# Patient Record
Sex: Male | Born: 1935 | Race: White | Hispanic: No | Marital: Married | Smoking: Never smoker
Health system: Southern US, Community
[De-identification: ages and names within clinical notes are randomized; demographics above are authoritative.]

## PROBLEM LIST (undated history)

## (undated) DIAGNOSIS — E785 Hyperlipidemia, unspecified: Secondary | ICD-10-CM

## (undated) DIAGNOSIS — F0280 Dementia in other diseases classified elsewhere without behavioral disturbance: Secondary | ICD-10-CM

## (undated) DIAGNOSIS — F0390 Unspecified dementia without behavioral disturbance: Secondary | ICD-10-CM

## (undated) DIAGNOSIS — K219 Gastro-esophageal reflux disease without esophagitis: Secondary | ICD-10-CM

## (undated) DIAGNOSIS — I10 Essential (primary) hypertension: Secondary | ICD-10-CM

## (undated) DIAGNOSIS — G309 Alzheimer's disease, unspecified: Secondary | ICD-10-CM

---

## 1999-04-11 ENCOUNTER — Encounter: Payer: Self-pay | Admitting: General Surgery

## 1999-04-16 ENCOUNTER — Ambulatory Visit (HOSPITAL_COMMUNITY): Admission: RE | Admit: 1999-04-16 | Discharge: 1999-04-16 | Payer: Self-pay | Admitting: General Surgery

## 2012-03-01 ENCOUNTER — Emergency Department (HOSPITAL_COMMUNITY)
Admission: EM | Admit: 2012-03-01 | Discharge: 2012-03-01 | Disposition: A | Discharge status: Other Acute Inpatient Hospital | Payer: Medicare Other | Attending: Emergency Medicine | Admitting: Emergency Medicine

## 2012-03-01 ENCOUNTER — Emergency Department (HOSPITAL_COMMUNITY): Payer: Medicare Other

## 2012-03-01 ENCOUNTER — Encounter (HOSPITAL_COMMUNITY): Payer: Self-pay

## 2012-03-01 ENCOUNTER — Other Ambulatory Visit: Payer: Self-pay

## 2012-03-01 DIAGNOSIS — R279 Unspecified lack of coordination: Secondary | ICD-10-CM | POA: Insufficient documentation

## 2012-03-01 DIAGNOSIS — Z79899 Other long term (current) drug therapy: Secondary | ICD-10-CM | POA: Insufficient documentation

## 2012-03-01 DIAGNOSIS — R270 Ataxia, unspecified: Secondary | ICD-10-CM

## 2012-03-01 DIAGNOSIS — I10 Essential (primary) hypertension: Secondary | ICD-10-CM | POA: Insufficient documentation

## 2012-03-01 DIAGNOSIS — Z7982 Long term (current) use of aspirin: Secondary | ICD-10-CM | POA: Insufficient documentation

## 2012-03-01 DIAGNOSIS — E785 Hyperlipidemia, unspecified: Secondary | ICD-10-CM | POA: Insufficient documentation

## 2012-03-01 DIAGNOSIS — F0390 Unspecified dementia without behavioral disturbance: Secondary | ICD-10-CM | POA: Insufficient documentation

## 2012-03-01 DIAGNOSIS — K219 Gastro-esophageal reflux disease without esophagitis: Secondary | ICD-10-CM | POA: Insufficient documentation

## 2012-03-01 HISTORY — DX: Hyperlipidemia, unspecified: E78.5

## 2012-03-01 HISTORY — DX: Essential (primary) hypertension: I10

## 2012-03-01 HISTORY — DX: Gastro-esophageal reflux disease without esophagitis: K21.9

## 2012-03-01 HISTORY — DX: Unspecified dementia, unspecified severity, without behavioral disturbance, psychotic disturbance, mood disturbance, and anxiety: F03.90

## 2012-03-01 LAB — URINALYSIS, ROUTINE W REFLEX MICROSCOPIC
Glucose, UA: NEGATIVE mg/dL
Hgb urine dipstick: NEGATIVE
Ketones, ur: NEGATIVE mg/dL
Leukocytes, UA: NEGATIVE
Protein, ur: NEGATIVE mg/dL
pH: 6 (ref 5.0–8.0)

## 2012-03-01 LAB — POCT I-STAT, CHEM 8
BUN: 20 mg/dL (ref 6–23)
Calcium, Ion: 1.18 mmol/L (ref 1.13–1.30)
Chloride: 106 mEq/L (ref 96–112)
Creatinine, Ser: 1.4 mg/dL — ABNORMAL HIGH (ref 0.50–1.35)
TCO2: 27 mmol/L (ref 0–100)

## 2012-03-01 MED ORDER — SODIUM CHLORIDE 0.9 % IV BOLUS (SEPSIS): 1000.0000 mL | INTRAVENOUS | Status: DC

## 2012-03-01 NOTE — ED Provider Notes (Signed)
History     CSN: [id number]  Arrival date & time 03/01/12  1037   None     Chief Complaint  Patient presents with  . Altered Mental Status    (Consider location/radiation/quality/duration/timing/severity/associated sxs/prior treatment) Patient is a 79 y.o. male presenting with neurologic complaint. History provided by: EMS, anna at facility.  Neurologic Problem Primary symptoms do not include headaches, fever, nausea or vomiting. The symptoms began 2 to 6 hours ago. The symptoms are unchanged. The neurological symptoms are diffuse. Context: noticed today at facility.  Additional symptoms include loss of balance.    Past Medical History  Diagnosis Date  . Dementia   . Hypertension   . GERD (gastroesophageal reflux disease)   . Hyperlipidemia     No past surgical history on file.  No family history on file.  History  Substance Use Topics  . Smoking status: Unknown If Ever Smoked  . Smokeless tobacco: Not on file  . Alcohol Use: No      Review of Systems  Constitutional: Negative for fever.  HENT: Negative for rhinorrhea, drooling and neck pain.   Eyes: Negative for pain.  Respiratory: Negative for cough and shortness of breath.   Cardiovascular: Negative for chest pain and leg swelling.  Gastrointestinal: Negative for nausea, vomiting, abdominal pain and diarrhea.  Genitourinary: Negative for dysuria and hematuria.  Musculoskeletal: Negative for gait problem.  Skin: Negative for color change.  Neurological: Positive for loss of balance. Negative for numbness and headaches.  Hematological: Negative for adenopathy.  Psychiatric/Behavioral: Negative for behavioral problems.  All other systems reviewed and are negative.    Allergies  Review of patient's allergies indicates no known allergies.  Home Medications   Current Outpatient Rx  Name  Route  Sig  Dispense  Refill  . amLODipine (NORVASC) 5 MG tablet   Oral   Take 5 mg by mouth daily before  breakfast.         . aspirin 81 MG chewable tablet   Oral   Chew 81 mg by mouth at bedtime.         . Calcium Carbonate-Vitamin D (CALCIUM 600 + D PO)   Oral   Take 1 tablet by mouth daily.         Marland Kitchen donepezil (ARICEPT) 10 MG tablet   Oral   Take 10 mg by mouth at bedtime.         Marland Kitchen escitalopram (LEXAPRO) 20 MG tablet   Oral   Take 20 mg by mouth daily.         Marland Kitchen lamoTRIgine (LAMICTAL) 200 MG tablet   Oral   Take 200 mg by mouth 2 (two) times daily. Takes with two 25 mg tablets to equal 250 twice daily         . lamoTRIgine (LAMICTAL) 25 MG tablet   Oral   Take 50 mg by mouth 2 (two) times daily. Takes 2 tablets along with 200 mg tablet to equal 250 mg twice daily         . loperamide (IMODIUM) 2 MG capsule   Oral   Take 2 mg by mouth 4 (four) times daily as needed for diarrhea or loose stools.         Marland Kitchen LORazepam (ATIVAN) 2 MG/ML concentrated solution   Oral   Take 0.3 mg by mouth 2 (two) times daily as needed (for anxiety/agitation).         Marland Kitchen lovastatin (MEVACOR) 20 MG tablet   Oral  Take 20 mg by mouth at bedtime.         . magnesium hydroxide (MILK OF MAGNESIA) 400 MG/5ML suspension   Oral   Take 30 mLs by mouth at bedtime as needed for constipation.         . metoprolol tartrate (LOPRESSOR) 25 MG tablet   Oral   Take 25 mg by mouth 2 (two) times daily.         Marland Kitchen omeprazole (PRILOSEC) 20 MG capsule   Oral   Take 20 mg by mouth every evening.         . ramipril (ALTACE) 10 MG capsule   Oral   Take 10 mg by mouth daily before breakfast.         . risperiDONE (RISPERDAL) 1 MG/ML oral solution   Oral   Take 0.5 mg by mouth 2 (two) times daily.           BP 165/57  Pulse 59  Temp(Src) 97.7 F (36.5 C) (Oral)  Resp 13  SpO2 97%  Physical Exam  Nursing note and vitals reviewed. Constitutional: He appears well-developed and well-nourished.  HENT:  Head: Normocephalic and atraumatic.  Right Ear: External ear normal.   Left Ear: External ear normal.  Nose: Nose normal.  Mouth/Throat: Oropharynx is clear and moist. No oropharyngeal exudate.  Eyes: Conjunctivae and EOM are normal. Pupils are equal, round, and reactive to light.  Neck: Normal range of motion. Neck supple.  Cardiovascular: Normal rate, regular rhythm, normal heart sounds and intact distal pulses.  Exam reveals no gallop and no friction rub.   No murmur heard. Pulmonary/Chest: Effort normal and breath sounds normal. No respiratory distress. He has no wheezes.  Abdominal: Soft. Bowel sounds are normal. He exhibits no distension. There is no tenderness. There is no rebound and no guarding.  Musculoskeletal: Normal range of motion. He exhibits no edema and no tenderness.  Neurological: He is alert. He has normal strength. No cranial nerve deficit or sensory deficit.  Pt a/o x 1. Pt able to stand, but has unsteady gait requiring assistance. No truncal ataxia noted.   Skin: Skin is warm and dry.  Psychiatric: He has a normal mood and affect. His behavior is normal.    ED Course  Procedures (including critical care time)  Labs Reviewed  URINALYSIS, ROUTINE W REFLEX MICROSCOPIC - Abnormal; Notable for the following:    APPearance CLOUDY (*)    All other components within normal limits  POCT I-STAT, CHEM 8 - Abnormal; Notable for the following:    Creatinine, Ser 1.40 (*)    Hemoglobin 12.9 (*)    HCT 38.0 (*)    All other components within normal limits   Ct Head Wo Contrast  03/01/2012  *RADIOLOGY REPORT*  Clinical Data: Difficulty walking.  Alzheimer's.  CT HEAD WITHOUT CONTRAST  Technique:  Contiguous axial images were obtained from the base of the skull through the vertex without contrast.  Comparison: None  Findings: Moderate to advanced atrophy.  Mild chronic microvascular ischemia in the white matter.  Negative for acute infarct. Negative for hemorrhage or mass.  No subdural hemorrhage.  No midline shift.  Calvarium is intact  IMPRESSION:  Atrophy and chronic microvascular ischemia.  No acute abnormality.   Original Report Authenticated By: Janeece Riggers, M.D.      1. Ataxia     Date: 03/01/2012  Rate: 61  Rhythm: sinus rhythm w/ 1st degree AV block  QRS Axis: normal  Intervals: PR prolonged  ST/T Wave abnormalities: normal  Conduction Disutrbances:right bundle branch block and PVC  Narrative Interpretation: No new ST or T wave changes cw ischemia  Old EKG Reviewed: changes noted     MDM  9:10 PM 79 y.o. male w hx of multi-infarct dementia, HTN presents from facility w/ ataxia that began this morning. I spoke w/ Tobi Bastos (med tech) at her facility to confirm this, she states he has otherwise been well. Pt is a/o x 1 on exam, interactive w/out motor/sensory deficits. Pt able to stand, but does have difficulty ambulating w/out assistance. Will get screening ecg, labs, urine, CT head.    9:10 PM: Pt tolerated a meal, labs/imaging non-contrib. He is now ambulating w/ less assistance. Unknown cause of his ataxia, but as he appears well will rec f/u w/ his pcp.  I have discussed the diagnosis/risks/treatment options with the patient and believe the pt to be eligible for discharge home to follow-up with his pcp in 1-2 days. We also discussed returning to the ED immediately if new or worsening sx occur. We discussed the sx which are most concerning (e.g., worsening ataxia, AMS, neuro deficits) that necessitate immediate return. Any new prescriptions provided to the patient are listed below.  Discharge Medication List as of 03/01/2012  3:22 PM     Clinical Impression 1. Ataxia       Purvis Sheffield, MD 03/01/12 2110  Purvis Sheffield, MD 03/01/12 2112  Purvis Sheffield, MD 03/01/12 2113

## 2012-03-01 NOTE — ED Notes (Signed)
[id number]<AV> Expected date:03/01/12<BR> Expected time:10:31 AM<BR> Means of arrival:Ambulance<BR> Comments:<BR> Alz, pt, more confused today

## 2012-03-01 NOTE — ED Notes (Addendum)
Sleeping intermittently, pleasant and following commands. Oriented to only person. MD has been at bedside.

## 2012-03-01 NOTE — ED Notes (Signed)
Patient transported to CT 

## 2012-03-01 NOTE — ED Notes (Signed)
Per EMS comes from [facility] for [employer]. Staff states patient was weaker and a little more confused than normal. BP taken by staff 80/40. EMS called. Patient usually up and walking. CBG 113, SPO2 98% on RA

## 2012-03-01 NOTE — ED Notes (Signed)
Pt. ekg was performed. ekg was was seen  Dr. Rosalia Hammers

## 2012-03-01 NOTE — ED Notes (Signed)
Patient had to be moved to chair keeps getting out of bed. Officers & lab tech present. Suggested to Residence of giving patient something to calm patient request declined.  Patient moved now to hall to keep 1:1.

## 2012-03-01 NOTE — ED Notes (Signed)
Patient agitated. Passerby caught patient trying to get out of bed. Patient agitated and confused

## 2012-03-01 NOTE — ED Notes (Signed)
Patient has intermittent periods of confusion.Compliant most of the time when given direction. Daughter never arrived.

## 2012-03-01 NOTE — ED Provider Notes (Signed)
I saw and evaluated the patient, reviewed the resident's note and I agree with the findings and plan.   Labs and xrays reviewed--dementia likey as cause of current sx, stable for d/c  Toy Baker, MD 03/01/12 [phone]

## 2012-03-01 NOTE — ED Notes (Signed)
Out of wheelchair with assistance x2. MD present.

## 2012-03-01 NOTE — ED Notes (Addendum)
Daughter called inquiring about patient. Will be  here shortly

## 2012-03-03 NOTE — ED Provider Notes (Signed)
I saw and evaluated the patient, reviewed the resident's note and I agree with the findings and plan.  Keyatta Tolles T Ranyah Groeneveld, MD 03/03/12 0741 

## 2012-04-03 ENCOUNTER — Encounter (HOSPITAL_COMMUNITY): Payer: Self-pay | Admitting: *Deleted

## 2012-04-03 ENCOUNTER — Emergency Department (HOSPITAL_COMMUNITY)
Admission: EM | Admit: 2012-04-03 | Discharge: 2012-04-04 | Disposition: A | Discharge status: Skilled Nursing Facility | Payer: Medicare Other | Attending: Emergency Medicine | Admitting: Emergency Medicine

## 2012-04-03 DIAGNOSIS — R112 Nausea with vomiting, unspecified: Secondary | ICD-10-CM | POA: Insufficient documentation

## 2012-04-03 DIAGNOSIS — Z79899 Other long term (current) drug therapy: Secondary | ICD-10-CM | POA: Insufficient documentation

## 2012-04-03 DIAGNOSIS — K219 Gastro-esophageal reflux disease without esophagitis: Secondary | ICD-10-CM | POA: Insufficient documentation

## 2012-04-03 DIAGNOSIS — E785 Hyperlipidemia, unspecified: Secondary | ICD-10-CM | POA: Insufficient documentation

## 2012-04-03 DIAGNOSIS — I10 Essential (primary) hypertension: Secondary | ICD-10-CM | POA: Insufficient documentation

## 2012-04-03 DIAGNOSIS — R197 Diarrhea, unspecified: Secondary | ICD-10-CM | POA: Insufficient documentation

## 2012-04-03 LAB — POCT I-STAT, CHEM 8
BUN: 22 mg/dL (ref 6–23)
Creatinine, Ser: 1.4 mg/dL — ABNORMAL HIGH (ref 0.50–1.35)
Glucose, Bld: 128 mg/dL — ABNORMAL HIGH (ref 70–99)
Hemoglobin: 13.6 g/dL (ref 13.0–17.0)
Potassium: 4.2 mEq/L (ref 3.5–5.1)
Sodium: 140 mEq/L (ref 135–145)
TCO2: 25 mmol/L (ref 0–100)

## 2012-04-03 MED ORDER — SODIUM CHLORIDE 0.9 % IV BOLUS (SEPSIS)
500.0000 mL | Freq: Once | INTRAVENOUS | Status: AC
  Administered 2012-04-03: 500 mL via INTRAVENOUS

## 2012-04-03 MED ORDER — ONDANSETRON HCL 4 MG/2ML IJ SOLN: 4.0000 mg | Freq: Once | INTRAMUSCULAR | Status: DC

## 2012-04-03 MED ORDER — ONDANSETRON HCL 4 MG/2ML IJ SOLN
4.0000 mg | Freq: Once | INTRAMUSCULAR | Status: AC
  Administered 2012-04-03: 4 mg via INTRAVENOUS
  Filled 2012-04-03: qty 2

## 2012-04-03 MED ORDER — ONDANSETRON 4 MG PO TBDP: 4.0000 mg | ORAL_TABLET | Freq: Three times a day (TID) | ORAL | Status: DC | PRN

## 2012-04-03 MED ORDER — SODIUM CHLORIDE 0.9 % IV BOLUS (SEPSIS): 500.0000 mL | Freq: Once | INTRAVENOUS | Status: DC

## 2012-04-03 NOTE — ED Notes (Signed)
[id number]<VW> Expected date:<BR> Expected time:<BR> Means of arrival:<BR> Comments:<BR> EMS for 20

## 2012-04-03 NOTE — ED Notes (Signed)
Pt from [facility] on [address]. Pt with 1 episode of vomitting and 1 e

## 2012-04-03 NOTE — ED Notes (Signed)
Urine on sink in room

## 2012-04-03 NOTE — ED Notes (Signed)
Per NH staff other pt's at facility with n/v/d

## 2012-04-03 NOTE — ED Provider Notes (Signed)
History     CSN: [id number]  Arrival date & time 04/03/12  2058   First MD Initiated Contact with Patient 04/03/12 2137      Chief Complaint  Patient presents with  . Nausea  . Emesis  . Diarrhea    (Consider location/radiation/quality/duration/timing/severity/associated sxs/prior treatment) HPI Comments: 79 year old male who presents from the nursing facility by ambulance because of a complaint of nausea vomiting and diarrhea. Per the nursing facility there are multiple patients at the nursing facility to have had these similar symptoms, he has had one episode of vomiting prior to arrival. The patient is unable to state what his symptoms are, he states that he has no complaints other than nausea.  Level V caveat apply secondary to dementia  Patient is a 79 y.o. male presenting with vomiting and diarrhea. The history is provided by the patient, the EMS personnel and the nursing home.  Emesis Associated symptoms: diarrhea   Diarrhea Associated symptoms: vomiting     Past Medical History  Diagnosis Date  . Dementia   . Hypertension   . GERD (gastroesophageal reflux disease)   . Hyperlipidemia     History reviewed. No pertinent past surgical history.  History reviewed. No pertinent family history.  History  Substance Use Topics  . Smoking status: Unknown If Ever Smoked  . Smokeless tobacco: Not on file  . Alcohol Use: No      Review of Systems  Unable to perform ROS: Dementia  Gastrointestinal: Positive for vomiting and diarrhea.    Allergies  Review of patient's allergies indicates no known allergies.  Home Medications   Current Outpatient Rx  Name  Route  Sig  Dispense  Refill  . acetaminophen (TYLENOL) 500 MG tablet   Oral   Take 500 mg by mouth every 4 (four) hours as needed for pain. For headache or minor discomfort         . alum & mag hydroxide-simeth (MAALOX/MYLANTA) 200-200-20 MG/5ML suspension   Oral   Take 30 mLs by mouth every 6 (six)  hours as needed for indigestion.         Marland Kitchen amLODipine (NORVASC) 5 MG tablet   Oral   Take 5 mg by mouth daily before breakfast.         . aspirin 81 MG chewable tablet   Oral   Chew 81 mg by mouth at bedtime.         . Calcium Carbonate-Vitamin D (CALCIUM 600 + D PO)   Oral   Take 1 tablet by mouth daily.         Marland Kitchen donepezil (ARICEPT) 10 MG tablet   Oral   Take 10 mg by mouth at bedtime.         Marland Kitchen escitalopram (LEXAPRO) 20 MG tablet   Oral   Take 20 mg by mouth daily.         Marland Kitchen guaiFENesin (ROBITUSSIN) 100 MG/5ML SOLN   Oral   Take 10 mLs by mouth every 4 (four) hours as needed. For cough         . lamoTRIgine (LAMICTAL) 200 MG tablet   Oral   Take 200 mg by mouth 2 (two) times daily. Takes with two 25 mg tablets to equal 250 twice daily         . lamoTRIgine (LAMICTAL) 25 MG tablet   Oral   Take 50 mg by mouth 2 (two) times daily. Takes 2 tablets along with 200 mg tablet to equal 250 mg twice  daily         . lovastatin (MEVACOR) 20 MG tablet   Oral   Take 20 mg by mouth at bedtime.         . metoprolol tartrate (LOPRESSOR) 25 MG tablet   Oral   Take 25 mg by mouth 2 (two) times daily.         Marland Kitchen omeprazole (PRILOSEC) 20 MG capsule   Oral   Take 20 mg by mouth every evening.         . ondansetron (ZOFRAN) 4 MG tablet   Oral   Take 4 mg by mouth every 8 (eight) hours as needed for nausea.         . ramipril (ALTACE) 10 MG capsule   Oral   Take 10 mg by mouth daily before breakfast.         . loperamide (IMODIUM) 2 MG capsule   Oral   Take 2 mg by mouth 4 (four) times daily as needed for diarrhea or loose stools.         Marland Kitchen LORazepam (ATIVAN) 2 MG/ML concentrated solution   Oral   Take 0.5 mg by mouth 2 (two) times daily as needed (for anxiety/agitation).          . magnesium hydroxide (MILK OF MAGNESIA) 400 MG/5ML suspension   Oral   Take 30 mLs by mouth at bedtime as needed for constipation.         . ondansetron  (ZOFRAN ODT) 4 MG disintegrating tablet   Oral   Take 1 tablet (4 mg total) by mouth every 8 (eight) hours as needed for nausea.   10 tablet   0     BP 155/75  Pulse 71  Temp(Src) 97.5 F (36.4 C) (Oral)  Resp 16  SpO2 98%  Physical Exam  Nursing note and vitals reviewed. Constitutional: He appears well-developed and well-nourished. No distress.  HENT:  Head: Normocephalic and atraumatic.  Mouth/Throat: Oropharynx is clear and moist. No oropharyngeal exudate.  Eyes: Conjunctivae and EOM are normal. Pupils are equal, round, and reactive to light. Right eye exhibits no discharge. Left eye exhibits no discharge. No scleral icterus.  Neck: Normal range of motion. Neck supple. No JVD present. No thyromegaly present.  Cardiovascular: Normal rate, regular rhythm and intact distal pulses.  Exam reveals no gallop and no friction rub.   Murmur ( Soft systolic murmur) heard. Pulmonary/Chest: Effort normal and breath sounds normal. No respiratory distress. He has no wheezes. He has no rales.  Abdominal: Soft. He exhibits no distension and no mass. There is no tenderness.  No abdominal tenderness to palpation, increased bowel sounds  Musculoskeletal: Normal range of motion. He exhibits no edema and no tenderness.  Lymphadenopathy:    He has no cervical adenopathy.  Neurological: He is alert. Coordination normal.  Skin: Skin is warm and dry. No rash noted. No erythema.  Psychiatric: He has a normal mood and affect. His behavior is normal.    ED Course  Procedures (including critical care time)  Labs Reviewed  POCT I-STAT, CHEM 8 - Abnormal; Notable for the following:    Creatinine, Ser 1.40 (*)    Glucose, Bld 128 (*)    All other components within normal limits   No results found.   1. Nausea and vomiting   2. Diarrhea       MDM  The patient is benign in appearance, has persistent nausea, no fever, no abdominal tenderness, similar symptoms at the nursing facility  where he  currently resides. Zofran, basic metabolic panel, fluids, reevaluate   Metabolic panel is normal, the patient has not had any more vomiting and no diarrhea. Stable for discharge, states he feels better       Vida Roller, MD 04/03/12 2351

## 2012-04-04 NOTE — ED Notes (Signed)
Patient is alert and oriented x3.  He was given DC instructions and follow up visit instructions.  Patient gave verbal understanding.  He was DC ambulatory under his own power to home.  V/S stable.  He was not showing any signs of distress on DC 

## 2012-04-04 NOTE — ED Notes (Signed)
DC IV in left hand.  Dry, Clean, Intact

## 2012-11-13 ENCOUNTER — Encounter (HOSPITAL_COMMUNITY): Payer: Self-pay | Admitting: Emergency Medicine

## 2012-11-13 ENCOUNTER — Emergency Department (HOSPITAL_COMMUNITY)
Admission: EM | Admit: 2012-11-13 | Discharge: 2012-11-13 | Disposition: A | Discharge status: Other Acute Inpatient Hospital | Payer: Medicare Other | Attending: Emergency Medicine | Admitting: Emergency Medicine

## 2012-11-13 DIAGNOSIS — S61209A Unspecified open wound of unspecified finger without damage to nail, initial encounter: Secondary | ICD-10-CM | POA: Insufficient documentation

## 2012-11-13 DIAGNOSIS — I10 Essential (primary) hypertension: Secondary | ICD-10-CM | POA: Insufficient documentation

## 2012-11-13 DIAGNOSIS — K219 Gastro-esophageal reflux disease without esophagitis: Secondary | ICD-10-CM | POA: Insufficient documentation

## 2012-11-13 DIAGNOSIS — IMO0002 Reserved for concepts with insufficient information to code with codable children: Secondary | ICD-10-CM | POA: Insufficient documentation

## 2012-11-13 DIAGNOSIS — Y9389 Activity, other specified: Secondary | ICD-10-CM | POA: Insufficient documentation

## 2012-11-13 DIAGNOSIS — Y9210 Unspecified residential institution as the place of occurrence of the external cause: Secondary | ICD-10-CM | POA: Insufficient documentation

## 2012-11-13 DIAGNOSIS — W010XXA Fall on same level from slipping, tripping and stumbling without subsequent striking against object, initial encounter: Secondary | ICD-10-CM | POA: Insufficient documentation

## 2012-11-13 DIAGNOSIS — W06XXXA Fall from bed, initial encounter: Secondary | ICD-10-CM | POA: Insufficient documentation

## 2012-11-13 DIAGNOSIS — Z23 Encounter for immunization: Secondary | ICD-10-CM | POA: Insufficient documentation

## 2012-11-13 DIAGNOSIS — F0390 Unspecified dementia without behavioral disturbance: Secondary | ICD-10-CM | POA: Insufficient documentation

## 2012-11-13 DIAGNOSIS — S61213A Laceration without foreign body of left middle finger without damage to nail, initial encounter: Secondary | ICD-10-CM

## 2012-11-13 DIAGNOSIS — Z79899 Other long term (current) drug therapy: Secondary | ICD-10-CM | POA: Insufficient documentation

## 2012-11-13 DIAGNOSIS — Z7982 Long term (current) use of aspirin: Secondary | ICD-10-CM | POA: Insufficient documentation

## 2012-11-13 DIAGNOSIS — T07XXXA Unspecified multiple injuries, initial encounter: Secondary | ICD-10-CM

## 2012-11-13 DIAGNOSIS — E785 Hyperlipidemia, unspecified: Secondary | ICD-10-CM | POA: Insufficient documentation

## 2012-11-13 MED ORDER — TETANUS-DIPHTH-ACELL PERTUSSIS 5-2.5-18.5 LF-MCG/0.5 IM SUSP
0.5000 mL | Freq: Once | INTRAMUSCULAR | Status: AC
  Administered 2012-11-13: 0.5 mL via INTRAMUSCULAR
  Filled 2012-11-13: qty 0.5

## 2012-11-13 NOTE — ED Notes (Addendum)
Per EMS: Pt is from [facility]. Pt states he was getting out of bed and slipped and fell. Pt reports that he hit his head. Pt and facility denies LOC or being on anticoagulant therapy. Pt reports pain to the right side of the neck, bilateral hands, and left elbow. Pt has skin tears to the right posterior hand, left elbow, and a laceration to the left middle finger. Bleeding is controlled. Pt has dementia, however facility reports that the patient is at baseline.

## 2012-11-13 NOTE — ED Provider Notes (Signed)
CSN: [id number]     Arrival date & time 11/13/12  1102 History   First MD Initiated Contact with Patient 11/13/12 1108     Chief Complaint  Patient presents with  . Fall   (Consider location/radiation/quality/duration/timing/severity/associated sxs/prior Treatment) Patient is a 79 y.o. male presenting with fall. The history is provided by the patient and the EMS personnel. The history is limited by the condition of the patient.  Fall Pertinent negatives include no chest pain, no abdominal pain, no headaches and no shortness of breath.  pt with hx dementia, from [facility]. Per report, had fallen out of bed. Abrasions to dorsum right hand. Lac to left middle finger distal phalanx on edge of bed. No loc. pts mental status has remained c/w baseline per ems report. Pt denies any pain or c/o, other than pain localized to laceration site. No headache. No neck or back pain. No cp or sob. No abd pain. No nv. No numbness/weakness. Tetanus unknown. Pt denies other falls.     Past Medical History  Diagnosis Date  . Dementia   . Hypertension   . GERD (gastroesophageal reflux disease)   . Hyperlipidemia    History reviewed. No pertinent past surgical history. No family history on file. History  Substance Use Topics  . Smoking status: Unknown If Ever Smoked  . Smokeless tobacco: Not on file  . Alcohol Use: No    Review of Systems  Unable to perform ROS Constitutional: Negative for fever.  Eyes: Negative for visual disturbance.  Respiratory: Negative for shortness of breath.   Cardiovascular: Negative for chest pain.  Gastrointestinal: Negative for abdominal pain.  Genitourinary: Negative for flank pain.  Musculoskeletal: Negative for back pain and neck pain.  Skin: Positive for wound.  Neurological: Negative for weakness, numbness and headaches.  Hematological: Does not bruise/bleed easily.  Psychiatric/Behavioral: Negative for confusion.  ?limited reliability ros, dementia - level 5  caveat.  Allergies  Review of patient's allergies indicates no known allergies.  Home Medications   Current Outpatient Rx  Name  Route  Sig  Dispense  Refill  . acetaminophen (TYLENOL) 500 MG tablet   Oral   Take 500 mg by mouth every 4 (four) hours as needed for pain. For headache or minor discomfort         . alum & mag hydroxide-simeth (MAALOX/MYLANTA) 200-200-20 MG/5ML suspension   Oral   Take 30 mLs by mouth every 6 (six) hours as needed for indigestion.         Marland Kitchen amLODipine (NORVASC) 5 MG tablet   Oral   Take 5 mg by mouth daily before breakfast.         . aspirin 81 MG chewable tablet   Oral   Chew 81 mg by mouth at bedtime.         . Calcium Carbonate-Vitamin D (CALCIUM 600 + D PO)   Oral   Take 1 tablet by mouth daily.         Marland Kitchen donepezil (ARICEPT) 10 MG tablet   Oral   Take 10 mg by mouth at bedtime.         Marland Kitchen escitalopram (LEXAPRO) 20 MG tablet   Oral   Take 20 mg by mouth daily.         Marland Kitchen guaiFENesin (ROBITUSSIN) 100 MG/5ML SOLN   Oral   Take 10 mLs by mouth every 4 (four) hours as needed. For cough         . lamoTRIgine (LAMICTAL) 200 MG tablet  Oral   Take 200 mg by mouth 2 (two) times daily. Takes with two 25 mg tablets to equal 250 twice daily         . lamoTRIgine (LAMICTAL) 25 MG tablet   Oral   Take 50 mg by mouth 2 (two) times daily. Takes 2 tablets along with 200 mg tablet to equal 250 mg twice daily         . loperamide (IMODIUM) 2 MG capsule   Oral   Take 2 mg by mouth 4 (four) times daily as needed for diarrhea or loose stools.         Marland Kitchen LORazepam (ATIVAN) 2 MG/ML concentrated solution   Oral   Take 0.5 mg by mouth 2 (two) times daily as needed (for anxiety/agitation).          Marland Kitchen lovastatin (MEVACOR) 20 MG tablet   Oral   Take 20 mg by mouth at bedtime.         . magnesium hydroxide (MILK OF MAGNESIA) 400 MG/5ML suspension   Oral   Take 30 mLs by mouth at bedtime as needed for constipation.         .  metoprolol tartrate (LOPRESSOR) 25 MG tablet   Oral   Take 25 mg by mouth 2 (two) times daily.         Marland Kitchen omeprazole (PRILOSEC) 20 MG capsule   Oral   Take 20 mg by mouth every evening.         . ondansetron (ZOFRAN ODT) 4 MG disintegrating tablet   Oral   Take 1 tablet (4 mg total) by mouth every 8 (eight) hours as needed for nausea.   10 tablet   0   . ondansetron (ZOFRAN) 4 MG tablet   Oral   Take 4 mg by mouth every 8 (eight) hours as needed for nausea.         . ramipril (ALTACE) 10 MG capsule   Oral   Take 10 mg by mouth daily before breakfast.          BP 140/97  Pulse 57  Temp(Src) 98.3 F (36.8 C) (Oral)  Resp 14  SpO2 96% Physical Exam  Nursing note and vitals reviewed. Constitutional: He is oriented to person, place, and time. He appears well-developed and well-nourished. No distress.  HENT:  Head: Atraumatic.  Nose: Nose normal.  Mouth/Throat: Oropharynx is clear and moist.  No scalp or head sts or tenderness.   Eyes: Conjunctivae are normal. Pupils are equal, round, and reactive to light.  Neck: Normal range of motion. Neck supple. No tracheal deviation present.  Cardiovascular: Normal rate, regular rhythm, normal heart sounds and intact distal pulses.   Pulmonary/Chest: Effort normal and breath sounds normal. No accessory muscle usage. No respiratory distress. He exhibits no tenderness.  Abdominal: Soft. He exhibits no distension. There is no tenderness.  No abd wall contusion or bruising   Genitourinary:  No cva tenderness  Musculoskeletal: Normal range of motion. He exhibits no edema.  CTLS spine, non tender, aligned, no step off.  3 cm laceration to left middle finger distal phalanx. Normal cap refill distally. Tendon fxn intact. No focal bony tenderness. Abrasions dorsum right hand, no bony tenderness. Good rom bil ext without focal pain or bony tenderness. Distal pulses palp.   Neurological: He is alert and oriented to person, place, and  time.  Skin: Skin is warm and dry.  Psychiatric: He has a normal mood and affect.    ED Course  LACERATION REPAIR Date/Time: 11/13/2012 11:45 AM Performed by: Suzi Roots Authorized by: Suzi Roots Consent: Verbal consent obtained. Risks and benefits: risks, benefits and alternatives were discussed Consent given by: patient Laceration length: 3 cm Foreign bodies: no foreign bodies Tendon involvement: none Vascular damage: no Anesthesia: digital block Local anesthetic: lidocaine 2% without epinephrine Anesthetic total: 4 ml Preparation: Patient was prepped and draped in the usual sterile fashion. Irrigation solution: saline Irrigation method: syringe Amount of cleaning: standard Skin closure: 4-0 Prolene Number of sutures: 6 Technique: simple Dressing: 4x4 sterile gauze Patient tolerance: Patient tolerated the procedure well with no immediate complications.   (including critical care time)   EKG Interpretation   None       MDM  Wound sutured. Pt denies pain.  Spine nt. abd soft nt.  Good rom bil ext without pain or focal bony tenderness.  Pt appears stable for d/c.     Suzi Roots, MD 11/13/12 1146

## 2012-11-13 NOTE — ED Notes (Signed)
Bed: WA10 Expected date: 11/13/12 Expected time: 10:57 AM Means of arrival: Ambulance Comments: EMS 79 y/o fall

## 2013-01-10 ENCOUNTER — Emergency Department (HOSPITAL_COMMUNITY)
Admission: EM | Admit: 2013-01-10 | Discharge: 2013-01-10 | Disposition: A | Discharge status: Home / Self Care | Payer: Medicare Other | Attending: Emergency Medicine | Admitting: Emergency Medicine

## 2013-01-10 ENCOUNTER — Encounter (HOSPITAL_COMMUNITY): Payer: Self-pay | Admitting: Emergency Medicine

## 2013-01-10 DIAGNOSIS — Z862 Personal history of diseases of the blood and blood-forming organs and certain disorders involving the immune mechanism: Secondary | ICD-10-CM | POA: Insufficient documentation

## 2013-01-10 DIAGNOSIS — G309 Alzheimer's disease, unspecified: Secondary | ICD-10-CM | POA: Insufficient documentation

## 2013-01-10 DIAGNOSIS — IMO0002 Reserved for concepts with insufficient information to code with codable children: Secondary | ICD-10-CM | POA: Insufficient documentation

## 2013-01-10 DIAGNOSIS — Z8639 Personal history of other endocrine, nutritional and metabolic disease: Secondary | ICD-10-CM | POA: Insufficient documentation

## 2013-01-10 DIAGNOSIS — F0280 Dementia in other diseases classified elsewhere without behavioral disturbance: Secondary | ICD-10-CM | POA: Insufficient documentation

## 2013-01-10 DIAGNOSIS — Z79899 Other long term (current) drug therapy: Secondary | ICD-10-CM | POA: Insufficient documentation

## 2013-01-10 DIAGNOSIS — I10 Essential (primary) hypertension: Secondary | ICD-10-CM | POA: Insufficient documentation

## 2013-01-10 DIAGNOSIS — K117 Disturbances of salivary secretion: Secondary | ICD-10-CM | POA: Insufficient documentation

## 2013-01-10 DIAGNOSIS — F911 Conduct disorder, childhood-onset type: Secondary | ICD-10-CM | POA: Insufficient documentation

## 2013-01-10 DIAGNOSIS — K219 Gastro-esophageal reflux disease without esophagitis: Secondary | ICD-10-CM | POA: Insufficient documentation

## 2013-01-10 LAB — BASIC METABOLIC PANEL
BUN: 16 mg/dL (ref 6–23)
GFR calc Af Amer: 60 mL/min — ABNORMAL LOW (ref >90)
GFR calc non Af Amer: 52 mL/min — ABNORMAL LOW (ref >90)
Potassium: 3.6 mEq/L (ref 3.5–5.1)
Sodium: 136 mEq/L (ref 135–145)

## 2013-01-10 LAB — CBC WITH DIFFERENTIAL/PLATELET
Basophils Relative: 0 % (ref 0–1)
Eosinophils Absolute: 0.1 10*3/uL (ref 0.0–0.7)
Eosinophils Relative: 3 % (ref 0–5)
Hemoglobin: 12.4 g/dL — ABNORMAL LOW (ref 13.0–17.0)
Lymphs Abs: 1.4 10*3/uL (ref 0.7–4.0)
MCH: 29.3 pg (ref 26.0–34.0)
MCHC: 32 g/dL (ref 30.0–36.0)
MCV: 91.7 fL (ref 78.0–100.0)
Monocytes Relative: 12 % (ref 3–12)
RBC: 4.23 MIL/uL (ref 4.22–5.81)

## 2013-01-10 LAB — URINALYSIS, ROUTINE W REFLEX MICROSCOPIC
Bilirubin Urine: NEGATIVE
Glucose, UA: NEGATIVE mg/dL
Hgb urine dipstick: NEGATIVE
Ketones, ur: NEGATIVE mg/dL
Protein, ur: NEGATIVE mg/dL
Urobilinogen, UA: 1 mg/dL (ref 0.0–1.0)

## 2013-01-10 MED ORDER — LORAZEPAM 1 MG PO TABS: 1.0000 mg | ORAL_TABLET | Freq: Three times a day (TID) | ORAL | Status: DC | PRN

## 2013-01-10 MED ORDER — ONDANSETRON HCL 4 MG PO TABS: 4.0000 mg | ORAL_TABLET | Freq: Three times a day (TID) | ORAL | Status: DC | PRN

## 2013-01-10 MED ORDER — ACETAMINOPHEN 325 MG PO TABS: 650.0000 mg | ORAL_TABLET | ORAL | Status: DC | PRN

## 2013-01-10 NOTE — ED Notes (Addendum)
Pt keep coming out of his room frequently, asking why is he here and stating :I'm ready to go now". Pt is redirected back to his room easily.
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
## 2013-01-10 NOTE — ED Notes (Signed)
Bed: XB14 Expected date: 01/10/13 Expected time: 1:49 PM Means of arrival: Ambulance Comments: Rm 13 Elderly med clearance 2.5 haldol given, no VS

## 2013-01-10 NOTE — ED Notes (Signed)
Pts daughter at bedside  
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
## 2013-01-11 LAB — URINE CULTURE

## 2013-01-12 NOTE — ED Provider Notes (Signed)
Medical screening examination/treatment/procedure(s) were conducted as a shared visit with non-physician practitioner(s) and myself.  I personally evaluated the patient during the encounter.  EKG Interpretation   None       Pt w hx dementia from ecf w behavioral issues, periods agitated behavior.  Hx same. Pt currently alert, cooperative, confused - mental status described as being c/w baseline. No signs of trauma on exam. Afeb.   Suzi Roots, MD 01/12/13 [phone]

## 2013-03-03 ENCOUNTER — Encounter (HOSPITAL_COMMUNITY): Payer: Self-pay | Admitting: Emergency Medicine

## 2013-03-03 ENCOUNTER — Emergency Department (HOSPITAL_COMMUNITY): Payer: Medicare Other

## 2013-03-03 ENCOUNTER — Emergency Department (HOSPITAL_COMMUNITY)
Admission: EM | Admit: 2013-03-03 | Discharge: 2013-03-03 | Disposition: A | Discharge status: Home / Self Care | Payer: Medicare Other | Attending: Emergency Medicine | Admitting: Emergency Medicine

## 2013-03-03 DIAGNOSIS — Z79899 Other long term (current) drug therapy: Secondary | ICD-10-CM | POA: Insufficient documentation

## 2013-03-03 DIAGNOSIS — F0390 Unspecified dementia without behavioral disturbance: Secondary | ICD-10-CM | POA: Diagnosis not present

## 2013-03-03 DIAGNOSIS — R5381 Other malaise: Secondary | ICD-10-CM | POA: Diagnosis present

## 2013-03-03 DIAGNOSIS — Z7982 Long term (current) use of aspirin: Secondary | ICD-10-CM | POA: Diagnosis not present

## 2013-03-03 DIAGNOSIS — I10 Essential (primary) hypertension: Secondary | ICD-10-CM | POA: Insufficient documentation

## 2013-03-03 DIAGNOSIS — E785 Hyperlipidemia, unspecified: Secondary | ICD-10-CM | POA: Insufficient documentation

## 2013-03-03 DIAGNOSIS — K219 Gastro-esophageal reflux disease without esophagitis: Secondary | ICD-10-CM | POA: Insufficient documentation

## 2013-03-03 DIAGNOSIS — R5383 Other fatigue: Secondary | ICD-10-CM | POA: Diagnosis present

## 2013-03-03 LAB — CBC WITH DIFFERENTIAL/PLATELET
Basophils Absolute: 0 10*3/uL (ref 0.0–0.1)
Basophils Relative: 1 % (ref 0–1)
EOS ABS: 0.1 10*3/uL (ref 0.0–0.7)
EOS PCT: 2 % (ref 0–5)
HCT: 39.2 % (ref 39.0–52.0)
HEMOGLOBIN: 13.1 g/dL (ref 13.0–17.0)
Lymphocytes Relative: 28 % (ref 12–46)
Lymphs Abs: 1.1 10*3/uL (ref 0.7–4.0)
MCH: 30.5 pg (ref 26.0–34.0)
MCHC: 33.4 g/dL (ref 30.0–36.0)
MCV: 91.4 fL (ref 78.0–100.0)
MONOS PCT: 12 % (ref 3–12)
Monocytes Absolute: 0.5 10*3/uL (ref 0.1–1.0)
Neutro Abs: 2.3 10*3/uL (ref 1.7–7.7)
Neutrophils Relative %: 58 % (ref 43–77)
Platelets: 194 10*3/uL (ref 150–400)
RBC: 4.29 MIL/uL (ref 4.22–5.81)
RDW: 15 % (ref 11.5–15.5)
WBC: 4.1 10*3/uL (ref 4.0–10.5)

## 2013-03-03 LAB — URINALYSIS, ROUTINE W REFLEX MICROSCOPIC
Bilirubin Urine: NEGATIVE
Glucose, UA: NEGATIVE mg/dL
Hgb urine dipstick: NEGATIVE
KETONES UR: NEGATIVE mg/dL
Leukocytes, UA: NEGATIVE
NITRITE: NEGATIVE
PH: 7.5 (ref 5.0–8.0)
Protein, ur: NEGATIVE mg/dL
SPECIFIC GRAVITY, URINE: 1.011 (ref 1.005–1.030)
Urobilinogen, UA: 1 mg/dL (ref 0.0–1.0)

## 2013-03-03 LAB — COMPREHENSIVE METABOLIC PANEL
ALK PHOS: 104 U/L (ref 39–117)
ALT: 11 U/L (ref 0–53)
AST: 18 U/L (ref 0–37)
Albumin: 3.3 g/dL — ABNORMAL LOW (ref 3.5–5.2)
BUN: 14 mg/dL (ref 6–23)
CALCIUM: 8.7 mg/dL (ref 8.4–10.5)
CO2: 27 mEq/L (ref 19–32)
Chloride: 102 mEq/L (ref 96–112)
Creatinine, Ser: 1.12 mg/dL (ref 0.50–1.35)
GFR calc non Af Amer: 58 mL/min — ABNORMAL LOW (ref >90)
GFR, EST AFRICAN AMERICAN: 68 mL/min — AB (ref >90)
Glucose, Bld: 96 mg/dL (ref 70–99)
Potassium: 4.3 mEq/L (ref 3.7–5.3)
Sodium: 140 mEq/L (ref 137–147)
TOTAL PROTEIN: 7.4 g/dL (ref 6.0–8.3)
Total Bilirubin: 0.6 mg/dL (ref 0.3–1.2)

## 2013-03-03 LAB — TROPONIN I

## 2013-03-03 MED ORDER — SODIUM CHLORIDE 0.9 % IV SOLN
INTRAVENOUS | Status: DC
  Administered 2013-03-03: 1000 mL via INTRAVENOUS

## 2013-03-03 NOTE — Discharge Instructions (Signed)
Your evaluation today included a brain CAT scan, urinalysis, complete metabolic panel along with a complete blood count. There was an x-ray performed as well 2  which did not show any acute abnormalities. Followup with your doctor as needed Dementia Dementia is a general term for problems with brain function. A person with dementia has memory loss and a hard time with at least one other brain function such as thinking, speaking, or problem solving. Dementia can affect social functioning, how you do your job, your mood, or your personality. The changes may be hidden for a long time. The earliest forms of this disease are usually not detected by family or friends. Dementia can be:  Irreversible.  Potentially reversible.  Partially reversible.  Progressive. This means it can get worse over time. CAUSES  Irreversible dementia causes may include:  Degeneration of brain cells (Alzheimer's disease or lewy body dementia).  Multiple small strokes (vascular dementia).  Infection (chronic meningitis or Creutzfelt-Jakob disease).  Frontotemporal dementia. This affects younger people, age 39 to 105, compared to those who have Alzheimer's disease.  Dementia associated with other disorders like Parkinson's disease, Huntington's disease, or HIV-associated dementia. Potentially or partially reversible dementia causes may include:  Medicines.  Metabolic causes such as excessive alcohol intake, vitamin B12 deficiency, or thyroid disease.  Masses or pressure in the brain such as a tumor, blood clot, or hydrocephalus. SYMPTOMS  Symptoms are often hard to detect. Family members or coworkers may not notice them early in the disease process. Different people with dementia may have different symptoms. Symptoms can include:  A hard time with memory, especially recent memory. Long-term memory may not be impaired.  Asking the same question multiple times or forgetting something someone just said.  A hard  time speaking your thoughts or finding certain words.  A hard time solving problems or performing familiar tasks (such as how to use a telephone).  Sudden changes in mood.  Changes in personality, especially increasing moodiness or mistrust.  Depression.  A hard time understanding complex ideas that were never a problem in the past. DIAGNOSIS  There are no specific tests for dementia.   Your caregiver may recommend a thorough evaluation. This is because some forms of dementia can be reversible. The evaluation will likely include a physical exam and getting a detailed history from you and a family member. The history often gives the best clues and suggestions for a diagnosis.  Memory testing may be done. A detailed brain function evaluation called neuropsychologic testing may be helpful.  Lab tests and brain imaging (such as a CT scan or MRI scan) are sometimes important.  Sometimes observation and re-evaluation over time is very helpful. TREATMENT  Treatment depends on the cause.   If the problem is a vitamin deficiency, it may be helped or cured with supplements.  For dementias such as Alzheimer's disease, medicines are available to stabilize or slow the course of the disease. There are no cures for this type of dementia.  Your caregiver can help direct you to groups, organizations, and other caregivers to help with decisions in the care of you or your loved one. HOME CARE INSTRUCTIONS The care of individuals with dementia is varied and dependent upon the progression of the dementia. The following suggestions are intended for the person living with, or caring for, the person with dementia.  Create a safe environment.  Remove the locks on bathroom doors to prevent the person from accidentally locking himself or herself in.  Use childproof  latches on kitchen cabinets and any place where cleaning supplies, chemicals, or alcohol are kept.  Use childproof covers in unused electrical  outlets.  Install childproof devices to keep doors and windows secured.  Remove stove knobs or install safety knobs and an automatic shut-off on the stove.  Lower the temperature on water heaters.  Label medicines and keep them locked up.  Secure knives, lighters, matches, power tools, and guns, and keep these items out of reach.  Keep the house free from clutter. Remove rugs or anything that might contribute to a fall.  Remove objects that might break and hurt the person.  Make sure lighting is good, both inside and outside.  Install grab rails as needed.  Use a monitoring device to alert you to falls or other needs for help.  Reduce confusion.  Keep familiar objects and people around.  Use night lights or dim lights at night.  Label items or areas.  Use reminders, notes, or directions for daily activities or tasks.  Keep a simple, consistent routine for waking, meals, bathing, dressing, and bedtime.  Create a calm, quiet environment.  Place large clocks and calendars prominently.  Display emergency numbers and home address near all telephones.  Use cues to establish different times of the day. An example is to open curtains to let the natural light in during the day.   Use effective communication.  Choose simple words and short sentences.  Use a gentle, calm tone of voice.  Be careful not to interrupt.  If the person is struggling to find a word or communicate a thought, try to provide the word or thought.  Ask one question at a time. Allow the person ample time to answer questions. Repeat the question again if the person does not respond.  Reduce nighttime restlessness.  Provide a comfortable bed.  Have a consistent nighttime routine.  Ensure a regular walking or physical activity schedule. Involve the person in daily activities as much as possible.  Limit napping during the day.  Limit caffeine.  Attend social events that stimulate rather than  overwhelm the senses.  Encourage good nutrition and hydration.  Reduce distractions during meal times and snacks.  Avoid foods that are too hot or too cold.  Monitor chewing and swallowing ability.  Continue with routine vision, hearing, dental, and medical screenings.  Only give over-the-counter or prescription medicines as directed by the caregiver.  Monitor driving abilities. Do not allow the person to drive when safe driving is no longer possible.  Register with an identification program which could provide location assistance in the event of a missing person situation. SEEK MEDICAL CARE IF:   New behavioral problems start such as moodiness, aggressiveness, or seeing things that are not there (hallucinations).  Any new problem with brain function happens. This includes problems with balance, speech, or falling a lot.  Problems with swallowing develop.  Any symptoms of other illness happen. Small changes or worsening in any aspect of brain function can be a sign that the illness is getting worse. It can also be a sign of another medical illness such as infection. Seeing a caregiver right away is important. SEEK IMMEDIATE MEDICAL CARE IF:   A fever develops.  New or worsened confusion develops.  New or worsened sleepiness develops.  Staying awake becomes hard to do. Document Released: 07/03/2000 Document Revised: 04/01/2011 Document Reviewed: 06/04/2010 [facility] Patient Information 2014 [employer].

## 2013-03-03 NOTE — ED Notes (Signed)
Pt removed PIV.  Pt not able to comprehend instructions provided by RN and CNA.

## 2013-03-03 NOTE — ED Provider Notes (Signed)
CSN: [id number]     Arrival date & time 03/03/13  1128 History   First MD Initiated Contact with Patient 03/03/13 1134     No chief complaint on file.    (Consider location/radiation/quality/duration/timing/severity/associated sxs/prior Treatment) The history is provided by the patient and the EMS personnel. The history is limited by the condition of the patient.   patient here from the nursing home with complaints of weakness x4 days as well as trouble ambulating. No reported history of falls. Patient denies any headache, chest pain, abdominal pain. He does have a history of dementia therefore the history is limited. According to the nursing home records, no recent fever or chills. EMS was called and patient transported here. No treatments used prior to arrival. Symptoms are made better with nothing.  Past Medical History  Diagnosis Date  . Dementia   . Hypertension   . GERD (gastroesophageal reflux disease)   . Hyperlipidemia    No past surgical history on file. No family history on file. History  Substance Use Topics  . Smoking status: Unknown If Ever Smoked  . Smokeless tobacco: Not on file  . Alcohol Use: No    Review of Systems  Unable to perform ROS     Allergies  Review of patient's allergies indicates no known allergies.  Home Medications   Current Outpatient Rx  Name  Route  Sig  Dispense  Refill  . acetaminophen (TYLENOL) 500 MG tablet   Oral   Take 500 mg by mouth every 4 (four) hours as needed for pain.         Marland Kitchen aluminum-magnesium hydroxide-simethicone (MAALOX) 200-200-20 MG/5ML SUSP   Oral   Take 30 mLs by mouth every 6 (six) hours as needed (indegestion).         Marland Kitchen aspirin 81 MG chewable tablet   Oral   Chew 81 mg by mouth at bedtime.         . donepezil (ARICEPT) 10 MG tablet   Oral   Take 10 mg by mouth at bedtime.         Marland Kitchen guaiFENesin (ROBITUSSIN) 100 MG/5ML liquid   Oral   Take 10 mLs by mouth every 4 (four) hours as needed for  cough.         . loperamide (IMODIUM) 2 MG capsule   Oral   Take 2 mg by mouth 4 (four) times daily as needed for diarrhea or loose stools.         Marland Kitchen LORazepam (ATIVAN) 2 MG/ML concentrated solution   Oral   Take 0.5 mg by mouth 2 (two) times daily as needed (for anxiety/agitation).          . magnesium hydroxide (MILK OF MAGNESIA) 400 MG/5ML suspension   Oral   Take 30 mLs by mouth at bedtime as needed for constipation.         Marland Kitchen omeprazole (PRILOSEC) 20 MG capsule   Oral   Take 20 mg by mouth every evening.         . ondansetron (ZOFRAN) 4 MG tablet   Oral   Take 4 mg by mouth every 8 (eight) hours as needed for nausea.         Marland Kitchen PARoxetine (PAXIL) 20 MG tablet   Oral   Take 20 mg by mouth daily.         . ramipril (ALTACE) 10 MG capsule   Oral   Take 10 mg by mouth daily before breakfast.  There were no vitals taken for this visit. Physical Exam  Nursing note and vitals reviewed. Constitutional: He appears well-developed and well-nourished.  Non-toxic appearance. No distress.  HENT:  Head: Normocephalic and atraumatic.  Eyes: Conjunctivae, EOM and lids are normal. Pupils are equal, round, and reactive to light.  Neck: Normal range of motion. Neck supple. No tracheal deviation present. No mass present.  Cardiovascular: Normal rate, regular rhythm and normal heart sounds.  Exam reveals no gallop.   No murmur heard. Pulmonary/Chest: Effort normal and breath sounds normal. No stridor. No respiratory distress. He has no decreased breath sounds. He has no wheezes. He has no rhonchi. He has no rales.  Abdominal: Soft. Normal appearance and bowel sounds are normal. He exhibits no distension. There is no tenderness. There is no rebound and no CVA tenderness.  Musculoskeletal: Normal range of motion. He exhibits no edema and no tenderness.  Neurological: He is alert. He has normal strength. No cranial nerve deficit or sensory deficit. He displays a  negative Romberg sign. GCS eye subscore is 4. GCS verbal subscore is 5. GCS motor subscore is 6.  Skin: Skin is warm and dry. No abrasion and no rash noted.  Psychiatric: His affect is blunt. His speech is delayed. He is slowed.    ED Course  Procedures (including critical care time) Labs Review Labs Reviewed - No data to display Imaging Review No results found.  EKG Interpretation    Date/Time:  Wednesday March 03 2013 11:43:45 EST Ventricular Rate:  76 PR Interval:    QRS Duration: 162 QT Interval:  521 QTC Calculation: 586 R Axis:   14 Text Interpretation:  Complete AV block with wide QRS complex Ventricular premature complex Right bundle branch block No significant change since last tracing Confirmed by Ronnel Zuercher  MD, Nickayla Mcinnis ([id number]) on 03/03/2013 12:45:42 PM            MDM   Final diagnoses:  None   Patient with negative evaluation here and his neurological exam shows no focal deficits. He has good strength in all 4 extremities. Speech is normal. We'll discharge back to nursing home suspect that this is patient's baseline dementia    Toy BakerAnthony T Charan Prieto, MD 03/03/13 1430

## 2013-03-03 NOTE — ED Notes (Signed)
[facility]  Pt hx dementia. Pt caox2, pmsx4, nad. Pt denies pain anywhere, denies recent illness/injury.

## 2013-03-03 NOTE — ED Notes (Signed)
Notified [facility] for transportation 

## 2013-03-03 NOTE — ED Notes (Signed)
[facility] and provided [facility] report to include labs and image studies to Mia-RN.

## 2013-03-03 NOTE — ED Notes (Signed)
Candice to contact [facility] for transport back to [facility].

## 2013-03-04 LAB — URINE CULTURE
CULTURE: NO GROWTH
Colony Count: NO GROWTH

## 2013-06-17 ENCOUNTER — Emergency Department (HOSPITAL_COMMUNITY)
Admission: EM | Admit: 2013-06-17 | Discharge: 2013-06-17 | Disposition: A | Discharge status: Home / Self Care | Payer: Medicare Other | Attending: Emergency Medicine | Admitting: Emergency Medicine

## 2013-06-17 ENCOUNTER — Encounter (HOSPITAL_COMMUNITY): Payer: Self-pay | Admitting: Emergency Medicine

## 2013-06-17 DIAGNOSIS — F0390 Unspecified dementia without behavioral disturbance: Secondary | ICD-10-CM | POA: Diagnosis not present

## 2013-06-17 DIAGNOSIS — Y9389 Activity, other specified: Secondary | ICD-10-CM | POA: Insufficient documentation

## 2013-06-17 DIAGNOSIS — I10 Essential (primary) hypertension: Secondary | ICD-10-CM | POA: Diagnosis not present

## 2013-06-17 DIAGNOSIS — Y9210 Unspecified residential institution as the place of occurrence of the external cause: Secondary | ICD-10-CM | POA: Diagnosis not present

## 2013-06-17 DIAGNOSIS — Z043 Encounter for examination and observation following other accident: Secondary | ICD-10-CM | POA: Insufficient documentation

## 2013-06-17 DIAGNOSIS — R296 Repeated falls: Secondary | ICD-10-CM | POA: Insufficient documentation

## 2013-06-17 DIAGNOSIS — W19XXXA Unspecified fall, initial encounter: Secondary | ICD-10-CM

## 2013-06-17 DIAGNOSIS — Z79899 Other long term (current) drug therapy: Secondary | ICD-10-CM | POA: Diagnosis not present

## 2013-06-17 DIAGNOSIS — K219 Gastro-esophageal reflux disease without esophagitis: Secondary | ICD-10-CM | POA: Diagnosis not present

## 2013-06-17 DIAGNOSIS — Y92129 Unspecified place in nursing home as the place of occurrence of the external cause: Secondary | ICD-10-CM

## 2013-06-17 DIAGNOSIS — Z7982 Long term (current) use of aspirin: Secondary | ICD-10-CM | POA: Insufficient documentation

## 2013-06-17 DIAGNOSIS — E785 Hyperlipidemia, unspecified: Secondary | ICD-10-CM | POA: Diagnosis not present

## 2013-06-17 LAB — CBC WITH DIFFERENTIAL/PLATELET
Basophils Absolute: 0 10*3/uL (ref 0.0–0.1)
Basophils Relative: 1 % (ref 0–1)
Eosinophils Absolute: 0.1 10*3/uL (ref 0.0–0.7)
Eosinophils Relative: 4 % (ref 0–5)
HCT: 35.8 % — ABNORMAL LOW (ref 39.0–52.0)
Hemoglobin: 11.8 g/dL — ABNORMAL LOW (ref 13.0–17.0)
LYMPHS ABS: 1.5 10*3/uL (ref 0.7–4.0)
LYMPHS PCT: 43 % (ref 12–46)
MCH: 30 pg (ref 26.0–34.0)
MCHC: 33 g/dL (ref 30.0–36.0)
MCV: 91.1 fL (ref 78.0–100.0)
Monocytes Absolute: 0.5 10*3/uL (ref 0.1–1.0)
Monocytes Relative: 14 % — ABNORMAL HIGH (ref 3–12)
NEUTROS ABS: 1.3 10*3/uL — AB (ref 1.7–7.7)
NEUTROS PCT: 38 % — AB (ref 43–77)
PLATELETS: 227 10*3/uL (ref 150–400)
RBC: 3.93 MIL/uL — AB (ref 4.22–5.81)
RDW: 14.5 % (ref 11.5–15.5)
WBC: 3.5 10*3/uL — AB (ref 4.0–10.5)

## 2013-06-17 LAB — COMPREHENSIVE METABOLIC PANEL
ALK PHOS: 120 U/L — AB (ref 39–117)
ALT: 10 U/L (ref 0–53)
AST: 15 U/L (ref 0–37)
Albumin: 2.9 g/dL — ABNORMAL LOW (ref 3.5–5.2)
BUN: 18 mg/dL (ref 6–23)
CO2: 27 meq/L (ref 19–32)
Calcium: 9.1 mg/dL (ref 8.4–10.5)
Chloride: 102 mEq/L (ref 96–112)
Creatinine, Ser: 1.23 mg/dL (ref 0.50–1.35)
GFR calc Af Amer: 60 mL/min — ABNORMAL LOW (ref >90)
GFR, EST NON AFRICAN AMERICAN: 52 mL/min — AB (ref >90)
Glucose, Bld: 87 mg/dL (ref 70–99)
POTASSIUM: 4.5 meq/L (ref 3.7–5.3)
SODIUM: 139 meq/L (ref 137–147)
Total Bilirubin: 0.3 mg/dL (ref 0.3–1.2)
Total Protein: 7 g/dL (ref 6.0–8.3)

## 2013-06-17 LAB — URINALYSIS, ROUTINE W REFLEX MICROSCOPIC
BILIRUBIN URINE: NEGATIVE
Glucose, UA: NEGATIVE mg/dL
Hgb urine dipstick: NEGATIVE
Ketones, ur: NEGATIVE mg/dL
LEUKOCYTES UA: NEGATIVE
NITRITE: NEGATIVE
Protein, ur: NEGATIVE mg/dL
SPECIFIC GRAVITY, URINE: 1.008 (ref 1.005–1.030)
Urobilinogen, UA: 1 mg/dL (ref 0.0–1.0)
pH: 7 (ref 5.0–8.0)

## 2013-06-17 LAB — VALPROIC ACID LEVEL: Valproic Acid Lvl: <10 ug/mL — ABNORMAL LOW (ref 50.0–100.0)

## 2013-06-17 NOTE — ED Notes (Signed)
Bed: [facility] Expected date:  Expected time:  Means of arrival:  Comments: ems- elderly, fall, back pain

## 2013-06-17 NOTE — ED Provider Notes (Signed)
  Physical Exam  BP 132/68  Pulse 86  Temp(Src) 98.3 F (36.8 C) (Oral)  Resp 16  SpO2 98%  Physical Exam  ED Course  Procedures  MDM The patient's Depakote level is 0, however review of the current medications does not show Depakote. Will discharge back to nursing home      Juliet Rude. Rubin Payor, MD 06/17/13 [phone]

## 2013-06-17 NOTE — Discharge Instructions (Signed)
Recheck if he seems to have any pain, he did not have any pain or signs of injury in the ED.

## 2013-06-17 NOTE — ED Provider Notes (Signed)
CSN: [id number]     Arrival date & time 06/17/13  1244 History   First MD Initiated Contact with Patient 06/17/13 1306     Chief Complaint  Patient presents with  . Fall   Level V caveat for dementia  (Consider location/radiation/quality/duration/timing/severity/associated sxs/prior Treatment) HPI Patient presents to the emergency department via EMS from his nursing facility after he had a witnessed fall. Patient does not remember falling. Patient doesn't appear to have any pain. They report he is at his baseline.  PCP Dr Ovidio Kin  Past Medical History  Diagnosis Date  . Dementia   . Hypertension   . GERD (gastroesophageal reflux disease)   . Hyperlipidemia    History reviewed. No pertinent past surgical history. History reviewed. No pertinent family history. History  Substance Use Topics  . Smoking status: Unknown If Ever Smoked  . Smokeless tobacco: Not on file  . Alcohol Use: No  lives in ALF  Review of Systems  Unable to perform ROS: Dementia      Allergies  Review of patient's allergies indicates no known allergies.  Home Medications   Prior to Admission medications   Medication Sig Start Date End Date Taking? Authorizing Provider  acetaminophen (TYLENOL) 500 MG tablet Take 500 mg by mouth every 4 (four) hours as needed for pain.   Yes Historical Provider, MD  aluminum-magnesium hydroxide-simethicone (MAALOX) 200-200-20 MG/5ML SUSP Take 30 mLs by mouth every 6 (six) hours as needed (indegestion).   Yes Historical Provider, MD  aspirin 81 MG chewable tablet Chew 81 mg by mouth at bedtime.   Yes Historical Provider, MD  donepezil (ARICEPT) 10 MG tablet Take 10 mg by mouth at bedtime.   Yes Historical Provider, MD  guaiFENesin (ROBITUSSIN) 100 MG/5ML liquid Take 10 mLs by mouth every 6 (six) hours as needed for cough.    Yes Historical Provider, MD  loperamide (IMODIUM) 2 MG capsule Take 2 mg by mouth 4 (four) times daily as needed for diarrhea or loose stools.   Yes  Historical Provider, MD  magnesium hydroxide (MILK OF MAGNESIA) 400 MG/5ML suspension Take 30 mLs by mouth at bedtime as needed for constipation.   Yes Historical Provider, MD  omeprazole (PRILOSEC) 20 MG capsule Take 20 mg by mouth every evening.   Yes Historical Provider, MD  ondansetron (ZOFRAN) 4 MG tablet Take 4 mg by mouth every 8 (eight) hours as needed for nausea.   Yes Historical Provider, MD  PARoxetine (PAXIL) 40 MG tablet Take 40 mg by mouth daily after breakfast.   Yes Historical Provider, MD  PRESCRIPTION MEDICATION Apply 0.5 mg topically every 6 (six) hours as needed (for agitation). Lorazepam 0.5mg /ml gel   Yes Historical Provider, MD  QUEtiapine (SEROQUEL) 50 MG tablet Take 50 mg by mouth 2 (two) times daily.   Yes Historical Provider, MD  ramipril (ALTACE) 10 MG capsule Take 10 mg by mouth daily before breakfast.   Yes Historical Provider, MD  risperiDONE (RISPERDAL) 1 MG/ML oral solution Take 0.5 mg by mouth at bedtime.   Yes Historical Provider, MD  Vitamin D, Ergocalciferol, (DRISDOL) 50000 UNITS CAPS capsule Take 50,000 Units by mouth every 30 (thirty) days.   Yes Historical Provider, MD   BP 132/68  Pulse 86  Temp(Src) 98.3 F (36.8 C) (Oral)  Resp 16  SpO2 98%  Vital signs normal   Physical Exam  Nursing note and vitals reviewed. Constitutional: He appears well-developed and well-nourished.  Non-toxic appearance. He does not appear ill. No distress.  HENT:  Head: Normocephalic and atraumatic.  Right Ear: External ear normal.  Left Ear: External ear normal.  Nose: Nose normal. No mucosal edema or rhinorrhea.  Mouth/Throat: Oropharynx is clear and moist and mucous membranes are normal. No dental abscesses or uvula swelling.  nontender head and neck.   Eyes: Conjunctivae and EOM are normal. Pupils are equal, round, and reactive to light.  Neck: Normal range of motion and full passive range of motion without pain. Neck supple.  Cardiovascular: Normal rate, regular  rhythm and normal heart sounds.  Exam reveals no gallop and no friction rub.   No murmur heard. Pulmonary/Chest: Effort normal and breath sounds normal. No respiratory distress. He has no wheezes. He has no rhonchi. He has no rales. He exhibits no tenderness and no crepitus.  Abdominal: Soft. Normal appearance and bowel sounds are normal. He exhibits no distension. There is no tenderness. There is no rebound and no guarding.  Musculoskeletal: Normal range of motion. He exhibits no edema and no tenderness.  Moves all extremities well. No bruising or deformity.  Neurological: He is alert. He has normal strength. No cranial nerve deficit.  Skin: Skin is warm, dry and intact. No rash noted. No erythema. No pallor.  Psychiatric: He has a normal mood and affect. His speech is normal and behavior is normal. His mood appears not anxious.    ED Course  Procedures (including critical care time)  Patient has not had blood work done since February. Labsdone  16:50 lab has been called they received the blood only an hour ago.   17:00 pt left at change of shift to Dr Rubin Payor to get valproic acid level.   Labs Review Results for orders placed during the hospital encounter of 06/17/13  URINALYSIS, ROUTINE W REFLEX MICROSCOPIC      Result Value Ref Range   Color, Urine YELLOW  YELLOW   APPearance CLEAR  CLEAR   Specific Gravity, Urine 1.008  1.005 - 1.030   pH 7.0  5.0 - 8.0   Glucose, UA NEGATIVE  NEGATIVE mg/dL   Hgb urine dipstick NEGATIVE  NEGATIVE   Bilirubin Urine NEGATIVE  NEGATIVE   Ketones, ur NEGATIVE  NEGATIVE mg/dL   Protein, ur NEGATIVE  NEGATIVE mg/dL   Urobilinogen, UA 1.0  0.0 - 1.0 mg/dL   Nitrite NEGATIVE  NEGATIVE   Leukocytes, UA NEGATIVE  NEGATIVE  CBC WITH DIFFERENTIAL      Result Value Ref Range   WBC 3.5 (*) 4.0 - 10.5 K/uL   RBC 3.93 (*) 4.22 - 5.81 MIL/uL   Hemoglobin 11.8 (*) 13.0 - 17.0 g/dL   HCT 40.9 (*) 81.1 - 91.4 %   MCV 91.1  78.0 - 100.0 fL   MCH 30.0   26.0 - 34.0 pg   MCHC 33.0  30.0 - 36.0 g/dL   RDW 78.2  95.6 - 21.3 %   Platelets 227  150 - 400 K/uL   Neutrophils Relative % 38 (*) 43 - 77 %   Neutro Abs 1.3 (*) 1.7 - 7.7 K/uL   Lymphocytes Relative 43  12 - 46 %   Lymphs Abs 1.5  0.7 - 4.0 K/uL   Monocytes Relative 14 (*) 3 - 12 %   Monocytes Absolute 0.5  0.1 - 1.0 K/uL   Eosinophils Relative 4  0 - 5 %   Eosinophils Absolute 0.1  0.0 - 0.7 K/uL   Basophils Relative 1  0 - 1 %   Basophils Absolute 0.0  0.0 - 0.1 K/uL  COMPREHENSIVE METABOLIC PANEL      Result Value Ref Range   Sodium 139  137 - 147 mEq/L   Potassium 4.5  3.7 - 5.3 mEq/L   Chloride 102  96 - 112 mEq/L   CO2 27  19 - 32 mEq/L   Glucose, Bld 87  70 - 99 mg/dL   BUN 18  6 - 23 mg/dL   Creatinine, Ser 1.611.23  0.50 - 1.35 mg/dL   Calcium 9.1  8.4 - 09.610.5 mg/dL   Total Protein 7.0  6.0 - 8.3 g/dL   Albumin 2.9 (*) 3.5 - 5.2 g/dL   AST 15  0 - 37 U/L   ALT 10  0 - 53 U/L   Alkaline Phosphatase 120 (*) 39 - 117 U/L   Total Bilirubin 0.3  0.3 - 1.2 mg/dL   GFR calc non Af Amer 52 (*) >90 mL/min   GFR calc Af Amer 60 (*) >90 mL/min   Laboratory interpretation all normal except mild anemia .    Imaging Review No results found.   EKG Interpretation None      MDM   Final diagnoses:  Fall at nursing home    Disposition pending valproic acid level   Devoria AlbeIva Jamiyla Ishee, MD, Armando GangFACEP     Ward GivensIva L Maria Coin, MD 06/17/13 1655

## 2013-06-17 NOTE — ED Notes (Signed)
PTAR here for pt to transfer back to facility.

## 2013-06-17 NOTE — Progress Notes (Signed)
CSW met with the patient at bedside to complete this assessment.  Patient is pleasant but only oriented to self and place.  Patient reports that he will be returning to the [facility] once medically cleared.  Patient reports that his family is aware of this ED admission and he would not need me to call.  No further CSW involvement is needed.       [name], MSW, [place], 06/17/2013 Evening Clinical Social Worker [phone]

## 2013-06-17 NOTE — ED Notes (Signed)
EMS/PTAR notified of need to transport back to [facility]

## 2013-06-17 NOTE — ED Notes (Signed)
Per EMS, Pt, from [facility], presents for evaluation after an unwitnessed fall.  Pt denies pain or complaints.  Facility reports that Pt slipped and fell.  Denies LOC and no blood thinners.  Pt sent out for evaluation, per policy.  Pt at neuro baseline.

## 2013-08-19 ENCOUNTER — Emergency Department (HOSPITAL_COMMUNITY): Payer: Medicare Other

## 2013-08-19 ENCOUNTER — Observation Stay (HOSPITAL_COMMUNITY)
Admission: EM | Admit: 2013-08-19 | Discharge: 2013-08-21 | Disposition: A | Discharge status: Skilled Nursing Facility | Payer: Medicare Other | Attending: Internal Medicine | Admitting: Internal Medicine

## 2013-08-19 ENCOUNTER — Encounter (HOSPITAL_COMMUNITY): Payer: Self-pay | Admitting: Emergency Medicine

## 2013-08-19 DIAGNOSIS — Z791 Long term (current) use of non-steroidal anti-inflammatories (NSAID): Secondary | ICD-10-CM | POA: Insufficient documentation

## 2013-08-19 DIAGNOSIS — F0280 Dementia in other diseases classified elsewhere without behavioral disturbance: Secondary | ICD-10-CM | POA: Diagnosis present

## 2013-08-19 DIAGNOSIS — G309 Alzheimer's disease, unspecified: Secondary | ICD-10-CM | POA: Insufficient documentation

## 2013-08-19 DIAGNOSIS — E785 Hyperlipidemia, unspecified: Secondary | ICD-10-CM | POA: Diagnosis not present

## 2013-08-19 DIAGNOSIS — G253 Myoclonus: Secondary | ICD-10-CM | POA: Diagnosis present

## 2013-08-19 DIAGNOSIS — Z79899 Other long term (current) drug therapy: Secondary | ICD-10-CM | POA: Insufficient documentation

## 2013-08-19 DIAGNOSIS — I10 Essential (primary) hypertension: Secondary | ICD-10-CM | POA: Diagnosis present

## 2013-08-19 DIAGNOSIS — K219 Gastro-esophageal reflux disease without esophagitis: Secondary | ICD-10-CM | POA: Diagnosis not present

## 2013-08-19 DIAGNOSIS — R259 Unspecified abnormal involuntary movements: Secondary | ICD-10-CM | POA: Diagnosis present

## 2013-08-19 DIAGNOSIS — Z7982 Long term (current) use of aspirin: Secondary | ICD-10-CM | POA: Diagnosis not present

## 2013-08-19 HISTORY — DX: Alzheimer's disease, unspecified: G30.9

## 2013-08-19 HISTORY — DX: Dementia in other diseases classified elsewhere, unspecified severity, without behavioral disturbance, psychotic disturbance, mood disturbance, and anxiety: F02.80

## 2013-08-19 LAB — URINALYSIS, ROUTINE W REFLEX MICROSCOPIC
BILIRUBIN URINE: NEGATIVE
Glucose, UA: NEGATIVE mg/dL
HGB URINE DIPSTICK: NEGATIVE
Ketones, ur: NEGATIVE mg/dL
Leukocytes, UA: NEGATIVE
Nitrite: NEGATIVE
Protein, ur: NEGATIVE mg/dL
SPECIFIC GRAVITY, URINE: 1.014 (ref 1.005–1.030)
Urobilinogen, UA: 1 mg/dL (ref 0.0–1.0)
pH: 7.5 (ref 5.0–8.0)

## 2013-08-19 LAB — BASIC METABOLIC PANEL
ANION GAP: 11 (ref 5–15)
BUN: 16 mg/dL (ref 6–23)
CHLORIDE: 102 meq/L (ref 96–112)
CO2: 26 mEq/L (ref 19–32)
CREATININE: 1.33 mg/dL (ref 0.50–1.35)
Calcium: 9 mg/dL (ref 8.4–10.5)
GFR calc Af Amer: 55 mL/min — ABNORMAL LOW (ref >90)
GFR calc non Af Amer: 47 mL/min — ABNORMAL LOW (ref >90)
Glucose, Bld: 89 mg/dL (ref 70–99)
Potassium: 5.2 mEq/L (ref 3.7–5.3)
Sodium: 139 mEq/L (ref 137–147)

## 2013-08-19 LAB — CBC WITH DIFFERENTIAL/PLATELET
Basophils Absolute: 0 10*3/uL (ref 0.0–0.1)
Basophils Relative: 1 % (ref 0–1)
Eosinophils Absolute: 0.2 10*3/uL (ref 0.0–0.7)
Eosinophils Relative: 4 % (ref 0–5)
HEMATOCRIT: 38.5 % — AB (ref 39.0–52.0)
HEMOGLOBIN: 12.5 g/dL — AB (ref 13.0–17.0)
LYMPHS ABS: 1.7 10*3/uL (ref 0.7–4.0)
LYMPHS PCT: 42 % (ref 12–46)
MCH: 29.8 pg (ref 26.0–34.0)
MCHC: 32.5 g/dL (ref 30.0–36.0)
MCV: 91.7 fL (ref 78.0–100.0)
MONO ABS: 0.5 10*3/uL (ref 0.1–1.0)
MONOS PCT: 12 % (ref 3–12)
NEUTROS ABS: 1.6 10*3/uL — AB (ref 1.7–7.7)
Neutrophils Relative %: 41 % — ABNORMAL LOW (ref 43–77)
Platelets: 263 10*3/uL (ref 150–400)
RBC: 4.2 MIL/uL — ABNORMAL LOW (ref 4.22–5.81)
RDW: 14.8 % (ref 11.5–15.5)
WBC: 3.9 10*3/uL — AB (ref 4.0–10.5)

## 2013-08-19 LAB — MAGNESIUM: Magnesium: 2.2 mg/dL (ref 1.5–2.5)

## 2013-08-19 LAB — AMMONIA: Ammonia: 13 umol/L (ref 11–60)

## 2013-08-19 LAB — PHOSPHORUS: Phosphorus: 3.2 mg/dL (ref 2.3–4.6)

## 2013-08-19 MED ORDER — SODIUM CHLORIDE 0.9 % IV SOLN: 250.0000 mL | INTRAVENOUS | Status: DC | PRN

## 2013-08-19 MED ORDER — DICLOFENAC SODIUM 1 % TD GEL: 4.0000 g | Freq: Four times a day (QID) | TRANSDERMAL | Status: DC | PRN

## 2013-08-19 MED ORDER — ALUM & MAG HYDROXIDE-SIMETH 200-200-20 MG/5ML PO SUSP: 30.0000 mL | Freq: Four times a day (QID) | ORAL | Status: DC | PRN

## 2013-08-19 MED ORDER — QUETIAPINE FUMARATE 50 MG PO TABS
50.0000 mg | ORAL_TABLET | Freq: Two times a day (BID) | ORAL | Status: DC
  Administered 2013-08-20 – 2013-08-21 (×3): 50 mg via ORAL
  Filled 2013-08-19 (×4): qty 1

## 2013-08-19 MED ORDER — HEPARIN SODIUM (PORCINE) 5000 UNIT/ML IJ SOLN
5000.0000 [IU] | Freq: Three times a day (TID) | INTRAMUSCULAR | Status: DC
  Administered 2013-08-20 – 2013-08-21 (×5): 5000 [IU] via SUBCUTANEOUS
  Filled 2013-08-19 (×5): qty 1

## 2013-08-19 MED ORDER — SODIUM CHLORIDE 0.9 % IJ SOLN: 3.0000 mL | INTRAMUSCULAR | Status: DC | PRN

## 2013-08-19 MED ORDER — ACETAMINOPHEN 500 MG PO TABS: 500.0000 mg | ORAL_TABLET | ORAL | Status: DC | PRN

## 2013-08-19 MED ORDER — ALUMINUM-MAGNESIUM-SIMETHICONE 200-200-20 MG/5ML PO SUSP: 30.0000 mL | Freq: Four times a day (QID) | ORAL | Status: DC | PRN

## 2013-08-19 MED ORDER — DONEPEZIL HCL 10 MG PO TABS
10.0000 mg | ORAL_TABLET | Freq: Every day | ORAL | Status: DC
  Administered 2013-08-20: 10 mg via ORAL
  Filled 2013-08-19 (×2): qty 1

## 2013-08-19 MED ORDER — SODIUM CHLORIDE 0.9 % IJ SOLN
3.0000 mL | Freq: Two times a day (BID) | INTRAMUSCULAR | Status: DC
Start: 2013-08-19 — End: 2013-08-21
  Administered 2013-08-19 – 2013-08-21 (×4): 3 mL via INTRAVENOUS

## 2013-08-19 MED ORDER — PANTOPRAZOLE SODIUM 40 MG PO TBEC
40.0000 mg | DELAYED_RELEASE_TABLET | Freq: Every day | ORAL | Status: DC
  Administered 2013-08-20 – 2013-08-21 (×2): 40 mg via ORAL
  Filled 2013-08-19 (×2): qty 1

## 2013-08-19 MED ORDER — ASPIRIN 81 MG PO CHEW
81.0000 mg | CHEWABLE_TABLET | Freq: Every day | ORAL | Status: DC
  Administered 2013-08-20: 81 mg via ORAL
  Filled 2013-08-19 (×2): qty 1

## 2013-08-19 MED ORDER — RISPERIDONE 1 MG/ML PO SOLN
0.5000 mg | Freq: Every day | ORAL | Status: DC
  Administered 2013-08-20: 0.5 mg via ORAL
  Filled 2013-08-19 (×3): qty 0.5

## 2013-08-19 MED ORDER — RAMIPRIL 10 MG PO CAPS
10.0000 mg | ORAL_CAPSULE | Freq: Every day | ORAL | Status: DC
  Administered 2013-08-20 – 2013-08-21 (×2): 10 mg via ORAL
  Filled 2013-08-19: qty 2
  Filled 2013-08-19: qty 1
  Filled 2013-08-19: qty 2
  Filled 2013-08-19 (×2): qty 1

## 2013-08-19 NOTE — ED Notes (Signed)
Pt resting in bed, having occasional jerky movements of arms.

## 2013-08-19 NOTE — ED Provider Notes (Signed)
  Physical Exam  BP 138/93  Pulse 82  Temp(Src) 97.8 F (36.6 C) (Oral)  Resp 22  SpO2 100%  Physical Exam  ED Course  Procedures  Care assumed at sign out. Patient has myotonic jerks. Neuro consulted and felt that its likely from medication side effect. Recommend d/c paxil and admit for monitoring. Will admit to med/surg.    Richardean Canalavid H Jahmiyah Dullea, MD 08/19/13 [phone]

## 2013-08-19 NOTE — Consult Note (Signed)
NEURO HOSPITALIST CONSULT NOTE    Reason for Consult: Myclonus  HPI:                                                                                                                                          Ronald Perkins is an 80 y.o. male who resides at [facility]. Majority of history obtained from chart due to dementia. Per EMS, faculty at [facility] the [facility]'s Unit came to get pt ready for breakfast. Pt was shaking all over but was conscious and able to ask for medication to stop shaking. Pt did not fall or have any LOC. Pt has progressed alzheimers disease. No hx of seizures. When EMS arrived, pt's arms shaking occasionally, but whole body shaking stopped. On consultation patient is alert, trying to take IV out.  He is showing intermittent multifocal bilateral myoclonic activity of UE and at times jaw. He is actively visually hallucinating and seeing people behind myself who are not there.   Mag and Phos normal UA negative. UC pending Ammonia pending   Past Medical History  Diagnosis Date  . Dementia   . Hypertension   . GERD (gastroesophageal reflux disease)   . Hyperlipidemia   . Alzheimer disease     History reviewed. No pertinent past surgical history.   Family History: Unable to obtain  Social History:  reports that he does not drink alcohol or use illicit drugs. His tobacco history is not on file.  No Known Allergies  MEDICATIONS:                                                                                                                     No current facility-administered medications for this encounter.   Current Outpatient Prescriptions  Medication Sig Dispense Refill  . acetaminophen (TYLENOL) 500 MG tablet Take 500 mg by mouth every 4 (four) hours as needed for pain.      Marland Kitchen aluminum-magnesium hydroxide-simethicone (MAALOX) 200-200-20 MG/5ML SUSP Take 30 mLs by mouth every 6 (six) hours as needed (indegestion).      Marland Kitchen aspirin 81 MG  chewable tablet Chew 81 mg by mouth at bedtime.      . diclofenac sodium (VOLTAREN) 1 % GEL Apply 4 g topically 4 (four) times daily as needed (knee  pain).      . donepezil (ARICEPT) 10 MG tablet Take 10 mg by mouth at bedtime.      Marland Kitchen guaiFENesin (ROBITUSSIN) 100 MG/5ML liquid Take 10 mLs by mouth every 6 (six) hours as needed for cough.       . loperamide (IMODIUM) 2 MG capsule Take 2 mg by mouth 4 (four) times daily as needed for diarrhea or loose stools.      . magnesium hydroxide (MILK OF MAGNESIA) 400 MG/5ML suspension Take 30 mLs by mouth at bedtime as needed for constipation.      Marland Kitchen omeprazole (PRILOSEC) 20 MG capsule Take 20 mg by mouth every evening.      . ondansetron (ZOFRAN) 4 MG tablet Take 4 mg by mouth every 8 (eight) hours as needed for nausea.      Marland Kitchen PARoxetine (PAXIL) 40 MG tablet Take 40 mg by mouth daily after breakfast.      . PRESCRIPTION MEDICATION Apply 0.5 mg topically every 6 (six) hours as needed (for agitation). Lorazepam 0.5mg /ml gel      . QUEtiapine (SEROQUEL) 50 MG tablet Take 50 mg by mouth 2 (two) times daily.      . ramipril (ALTACE) 10 MG capsule Take 10 mg by mouth daily before breakfast.      . risperiDONE (RISPERDAL) 1 MG/ML oral solution Take 0.5 mg by mouth at bedtime.      . Vitamin D, Ergocalciferol, (DRISDOL) 50000 UNITS CAPS capsule Take 50,000 Units by mouth every 30 (thirty) days.          ROS:                                                                                                                                       History obtained from the patient  General ROS: negative for - chills, fatigue, fever, night sweats, weight gain or weight loss Psychological ROS: negative for - behavioral disorder, hallucinations, memory difficulties, mood swings or suicidal ideation Ophthalmic ROS: negative for - blurry vision, double vision, eye pain or loss of vision ENT ROS: negative for - epistaxis, nasal discharge, oral lesions, sore throat, tinnitus  or vertigo Allergy and Immunology ROS: negative for - hives or itchy/watery eyes Hematological and Lymphatic ROS: negative for - bleeding problems, bruising or swollen lymph nodes Endocrine ROS: negative for - galactorrhea, hair pattern changes, polydipsia/polyuria or temperature intolerance Respiratory ROS: negative for - cough, hemoptysis, shortness of breath or wheezing Cardiovascular ROS: negative for - chest pain, dyspnea on exertion, edema or irregular heartbeat Gastrointestinal ROS: negative for - abdominal pain, diarrhea, hematemesis, nausea/vomiting or stool incontinence Genito-Urinary ROS: negative for - dysuria, hematuria, incontinence or urinary frequency/urgency Musculoskeletal ROS: negative for - joint swelling or muscular weakness Neurological ROS: as noted in HPI Dermatological ROS: negative for rash and skin lesion changes   Blood pressure 117/99, pulse 82, temperature 97.8 F (36.6 C), temperature source Oral,  resp. rate 22, SpO2 96.00%.   Neurologic Examination:                                                                                                      General: NAD Mental Status: Alert, not oriented to place, year or month.  Follows commands.  Seeing people in room who are not actually there. Speech fluent without evidence of aphasia.  Able to follow simple commands without difficulty. Cranial Nerves: II: Discs flat bilaterally; Visual fields grossly normal, pupils equal, round, reactive to light and accommodation III,IV, VI: ptosis not present, extra-ocular motions intact bilaterally V,VII: Face symmetric, facial light touch sensation normal bilaterally. Intermittent lower jaw myoclonus.  VIII: hearing normal bilaterally IX,X: gag reflex present XI: bilateral shoulder shrug XII: midline tongue extension without atrophy or fasciculations  Motor: Right : Upper extremity   5/5    Left:     Upper extremity   5/5  Lower extremity   5/5     Lower extremity    5/5 --Intermittent bilateral UE myoclonus at times bilateral arms and other time unilateral.  Tone and bulk:normal tone throughout; no atrophy noted Sensory: Withdrawal from pain in all extremities.  Deep Tendon Reflexes:  Right: Upper Extremity   Left: Upper extremity   biceps (C-5 to C-6) 2/4   biceps (C-5 to C-6) 2/4 tricep (C7) 2/4    triceps (C7) 2/4 Brachioradialis (C6) 2/4  Brachioradialis (C6) 2/4  Lower Extremity Lower Extremity  quadriceps (L-2 to L-4) 1/4   quadriceps (L-2 to L-4) 1/4 Achilles (S1) 0/4   Achilles (S1) 0/4  Plantars: Right: downgoing   Left: downgoing Cerebellar: normal finger-to-nose,  Unable to obtain heel-to-shin test Gait: not tested CV: pulses palpable throughout    Lab Results: Basic Metabolic Panel:  Recent Labs Lab 08/19/13 1207  NA 139  K 5.2  CL 102  CO2 26  GLUCOSE 89  BUN 16  CREATININE 1.33  CALCIUM 9.0  MG 2.2  PHOS 3.2    Liver Function Tests: No results found for this basename: AST, ALT, ALKPHOS, BILITOT, PROT, ALBUMIN,  in the last 168 hours No results found for this basename: LIPASE, AMYLASE,  in the last 168 hours No results found for this basename: AMMONIA,  in the last 168 hours  CBC:  Recent Labs Lab 08/19/13 1207  WBC 3.9*  NEUTROABS 1.6*  HGB 12.5*  HCT 38.5*  MCV 91.7  PLT 263    Cardiac Enzymes: No results found for this basename: CKTOTAL, CKMB, CKMBINDEX, TROPONINI,  in the last 168 hours  Lipid Panel: No results found for this basename: CHOL, TRIG, HDL, CHOLHDL, VLDL, LDLCALC,  in the last 168 hours  CBG: No results found for this basename: GLUCAP,  in the last 168 hours  Microbiology: Results for orders placed during the hospital encounter of 03/03/13  URINE CULTURE     Status: None   Collection Time    03/03/13 11:43 AM      Result Value Ref Range Status   Specimen Description URINE, CLEAN CATCH   Final  Special Requests NONE   Final   Culture  Setup Time     Final   Value:  03/03/2013 11:25     Performed at [facility] Count     Final   Value: NO GROWTH     Performed at [facility]   Culture     Final   Value: NO GROWTH     Performed at [facility]   Report Status 03/04/2013 FINAL   Final    Coagulation Studies: No results found for this basename: LABPROT, INR,  in the last 72 hours  Imaging: Ct Head Wo Contrast  08/19/2013   CLINICAL DATA:  Tremors. Seizures new onset. Diabetic. Hypertension. Hyperlipidemia per  EXAM: CT HEAD WITHOUT CONTRAST  TECHNIQUE: Contiguous axial images were obtained from the base of the skull through the vertex without intravenous contrast.  COMPARISON:  03/03/2013 and 03/01/2012.  FINDINGS: No intracranial hemorrhage.  Small vessel disease type changes. Remote small infarct posterior left coronal radiata. No CT evidence of large acute infarct.  Atrophy global in distribution. Ventricular prominence most notable involving temporal horns greater on right unchanged and felt to be related to atrophy rather than hydrocephalus.  No intracranial mass lesion noted on this unenhanced exam.  Vascular calcifications.  IMPRESSION: Overall no significant change.  No intracranial hemorrhage.  Small vessel disease type changes. Remote small infarct posterior left coronal radiata. No CT evidence of large acute infarct.  Atrophy global in distribution. Ventricular prominence most notable involving temporal horns greater on right unchanged and felt to be related to atrophy rather than hydrocephalus.  No intracranial mass lesion noted on this unenhanced exam.   Electronically Signed   By: Bridgett Larsson M.D.   On: 08/19/2013 13:08    Felicie Morn PA-C [facility] [phone]  08/19/2013, 2:52 PM  Patient seen and examined.  Clinical course and management discussed.  Necessary edits performed.  I agree with the above.  Assessment and plan of care developed and discussed below.    Assessment/Plan:  80 YO  male who resides at [facility]. The patient was brought to hospital due to bilateral jerking activity.  On exam he shows multifocal myoclonus with is worsened with action and decreases at rest. No significant metabolic abnormalities noted.  No evidence of infection.  Concern is for medications.  Most likely culprit is Paxil.  At some point may need to consider antipsychotics as well.  Also on the differential are certain dementias that have associated myoclonus.  Further investigation required.    Recommendations: 1. D/C Paxil 2. NH4 3. EEG   Thana Farr, MD [facility] [phone]  08/19/2013  6:00 PM

## 2013-08-19 NOTE — ED Notes (Signed)
Meal tray ordered for pt. NT feeding pt.

## 2013-08-19 NOTE — H&P (Addendum)
[facility] History and Physical  Ronald DittoFrank J Machorro WUJ:[id number] DOB: 05/21/1926 DOA: 08/19/2013  Referring physician: Dr Silverio LayYao PCP: Ron ParkerBOWEN,SAMUEL, MD    Chief Complaint: sent for jerking movements  HPI: Ronald Perkins is a 80 y.o. male with a past medical history of Alzheimer's dementia, hypertension who presents from a nursing facility for jerking of the extremities. He is unable to provide a history due to his dementia therefore history is obtained from the ER doctor and the nurse. According to the ER doctor jerking was noted this morning when he went for breakfast. Neurology was consults in the ER and they suspected this may be medication related and would like to have him admitted for modification of medications to see if symptoms resolve. At this point complete neurology note is pending however per the ER, neurology is recommending to hold his Paxil.   Review of systems: Unable to obtain do to dementia-  the patient thinks he is 80 years old. He does not know where he is or what year it is.  Past Medical History  Diagnosis Date  . Dementia   . Hypertension   . GERD (gastroesophageal reflux disease)   . Hyperlipidemia   . Alzheimer disease     History reviewed. No pertinent past surgical history.  Social History[facility]  No Known Allergies  History reviewed. No pertinent family history.    Prior to Admission medications   Medication Sig Start Date End Date Taking? Authorizing Provider  acetaminophen (TYLENOL) 500 MG tablet Take 500 mg by mouth every 4 (four) hours as needed for pain.   Yes Historical Provider, MD  aluminum-magnesium hydroxide-simethicone (MAALOX) 200-200-20 MG/5ML SUSP Take 30 mLs by mouth every 6 (six) hours as needed (indegestion).   Yes Historical Provider, MD  aspirin 81 MG chewable tablet Chew 81 mg by mouth at bedtime.   Yes Historical Provider, MD  diclofenac sodium (VOLTAREN) 1 % GEL Apply 4 g topically 4  (four) times daily as needed (knee pain).   Yes Historical Provider, MD  donepezil (ARICEPT) 10 MG tablet Take 10 mg by mouth at bedtime.   Yes Historical Provider, MD  guaiFENesin (ROBITUSSIN) 100 MG/5ML liquid Take 10 mLs by mouth every 6 (six) hours as needed for cough.    Yes Historical Provider, MD  loperamide (IMODIUM) 2 MG capsule Take 2 mg by mouth 4 (four) times daily as needed for diarrhea or loose stools.   Yes Historical Provider, MD  magnesium hydroxide (MILK OF MAGNESIA) 400 MG/5ML suspension Take 30 mLs by mouth at bedtime as needed for constipation.   Yes Historical Provider, MD  omeprazole (PRILOSEC) 20 MG capsule Take 20 mg by mouth every evening.   Yes Historical Provider, MD  ondansetron (ZOFRAN) 4 MG tablet Take 4 mg by mouth every 8 (eight) hours as needed for nausea.   Yes Historical Provider, MD  PARoxetine (PAXIL) 40 MG tablet Take 40 mg by mouth daily after breakfast.   Yes Historical Provider, MD  PRESCRIPTION MEDICATION Apply 0.5 mg topically every 6 (six) hours as needed (for agitation). Lorazepam 0.5mg /ml gel   Yes Historical Provider, MD  QUEtiapine (SEROQUEL) 50 MG tablet Take 50 mg by mouth 2 (two) times daily.   Yes Historical Provider, MD  ramipril (ALTACE) 10 MG capsule Take 10 mg by mouth daily before breakfast.   Yes Historical Provider, MD  risperiDONE (RISPERDAL) 1 MG/ML oral solution Take 0.5 mg by mouth at bedtime.   Yes  Historical Provider, MD  Vitamin D, Ergocalciferol, (DRISDOL) 50000 UNITS CAPS capsule Take 50,000 Units by mouth every 30 (thirty) days.   Yes Historical Provider, MD     Physical Exam: Filed Vitals:   08/19/13 1300 08/19/13 1345 08/19/13 1458 08/19/13 1500  BP: 158/75 117/99 129/75 138/93  Pulse: 82     Temp:      TempSrc:      Resp: 20 22  22   SpO2: 96%  100%      General: Awake and alert and communicative. Confused and oriented only to person. He is quite restless and was trying to come out of bed when the nurse found him.  He's urinated on himself. HEENT: Normocephalic and Atraumatic, Mucous membranes pink                PERRLA; EOM intact; No scleral icterus,                 Nares: Patent, Oropharynx: Clear, Fair Dentition                 Neck: FROM, no cervical lymphadenopathy, thyromegaly, carotid bruit or JVD;  Breasts: deferred CHEST WALL: No tenderness  CHEST: Normal respiration, clear to auscultation bilaterally  HEART: Regular rate and rhythm; 2/6 murmur in the left upper sternal border  BACK: No kyphosis or scoliosis; no CVA tenderness  ABDOMEN: Positive Bowel Sounds, soft, non-tender; no masses, no organomegaly Rectal Exam: deferred EXTREMITIES: No cyanosis, clubbing, or edema Genitalia: not examined  SKIN:  no rash or ulceration  CNS: Alert and Oriented x 4, Nonfocal exam, CN 2-12 intact-mild myoclonic jerks of her arms and chest noted -he does not appear to be disturbed by this  Labs on Admission:  Basic Metabolic Panel:  Recent Labs Lab 08/19/13 1207  NA 139  K 5.2  CL 102  CO2 26  GLUCOSE 89  BUN 16  CREATININE 1.33  CALCIUM 9.0  MG 2.2  PHOS 3.2   Liver Function Tests: No results found for this basename: AST, ALT, ALKPHOS, BILITOT, PROT, ALBUMIN,  in the last 168 hours No results found for this basename: LIPASE, AMYLASE,  in the last 168 hours No results found for this basename: AMMONIA,  in the last 168 hours CBC:  Recent Labs Lab 08/19/13 1207  WBC 3.9*  NEUTROABS 1.6*  HGB 12.5*  HCT 38.5*  MCV 91.7  PLT 263   Cardiac Enzymes: No results found for this basename: CKTOTAL, CKMB, CKMBINDEX, TROPONINI,  in the last 168 hours  BNP (last 3 results) No results found for this basename: PROBNP,  in the last 8760 hours CBG: No results found for this basename: GLUCAP,  in the last 168 hours  Radiological Exams on Admission: Ct Head Wo Contrast  08/19/2013   CLINICAL DATA:  Tremors. Seizures new onset. Diabetic. Hypertension. Hyperlipidemia per  EXAM: CT HEAD WITHOUT  CONTRAST  TECHNIQUE: Contiguous axial images were obtained from the base of the skull through the vertex without intravenous contrast.  COMPARISON:  03/03/2013 and 03/01/2012.  FINDINGS: No intracranial hemorrhage.  Small vessel disease type changes. Remote small infarct posterior left coronal radiata. No CT evidence of large acute infarct.  Atrophy global in distribution. Ventricular prominence most notable involving temporal horns greater on right unchanged and felt to be related to atrophy rather than hydrocephalus.  No intracranial mass lesion noted on this unenhanced exam.  Vascular calcifications.  IMPRESSION: Overall no significant change.  No intracranial hemorrhage.  Small vessel disease type changes. Remote  small infarct posterior left coronal radiata. No CT evidence of large acute infarct.  Atrophy global in distribution. Ventricular prominence most notable involving temporal horns greater on right unchanged and felt to be related to atrophy rather than hydrocephalus.  No intracranial mass lesion noted on this unenhanced exam.   Electronically Signed   By: Bridgett Larsson M.D.   On: 08/19/2013 13:08    Assessment/Plan Principal Problem:   Myoclonic jerking -Appreciate management per neurology- will hold paxil as requested  Active Problems:   Alzheimer's dementia -Will allow neurology to manage medications for now    Hypertension -Continue ramipril  Consulted: Neurology  Code Status: Full code Family Communication: none DVT Prophylaxis:Lovenox  Time spent: 45 min  Tifanny Dollens, MD [facility]  If 7PM-7AM, please contact night-coverage [email or web address] 08/19/2013, 5:21 PM

## 2013-08-19 NOTE — ED Provider Notes (Signed)
CSN: [id number]     Arrival date & time 08/19/13  4098 History   None    Chief Complaint  Patient presents with  . Tremors     (Consider location/radiation/quality/duration/timing/severity/associated sxs/prior Treatment) HPI Comments: Per EMS, faculty at [facility] the Alzheimer's Unit came to get pt ready for breakfast. Pt was shaking all over but was conscious and able to ask for medication to stop shaking. Pt did not fall or have any LOC. Pt has progressed alzheimers disease. No hx of seizures. When EMS arrived, pt's arms shaking occasionally, but whole body shaking stopped. Pt at his baseline mentally.  Patient is a 80 y.o. male presenting with neurologic complaint. The history is provided by the patient. No language interpreter was used.  Neurologic Problem This is a new problem. The current episode started 3 to 5 hours ago. The problem occurs rarely. The problem has been resolved. Pertinent negatives include no chest pain, no abdominal pain, no headaches and no shortness of breath. Nothing aggravates the symptoms. Nothing relieves the symptoms. He has tried nothing for the symptoms. The treatment provided no relief.    Past Medical History  Diagnosis Date  . Dementia   . Hypertension   . GERD (gastroesophageal reflux disease)   . Hyperlipidemia   . Alzheimer disease    History reviewed. No pertinent past surgical history. History reviewed. No pertinent family history. History  Substance Use Topics  . Smoking status: Unknown If Ever Smoked  . Smokeless tobacco: Not on file  . Alcohol Use: No    Review of Systems  Constitutional: Negative for fever, activity change, appetite change and fatigue.  HENT: Negative for congestion, facial swelling, rhinorrhea and trouble swallowing.   Eyes: Negative for photophobia and pain.  Respiratory: Negative for cough, chest tightness and shortness of breath.   Cardiovascular: Negative for chest pain and leg swelling.   Gastrointestinal: Negative for nausea, vomiting, abdominal pain, diarrhea and constipation.  Endocrine: Negative for polydipsia and polyuria.  Genitourinary: Negative for dysuria, urgency, decreased urine volume and difficulty urinating.  Musculoskeletal: Negative for back pain and gait problem.  Skin: Negative for color change, rash and wound.  Allergic/Immunologic: Negative for immunocompromised state.  Neurological: Negative for dizziness, facial asymmetry, speech difficulty, weakness, numbness and headaches.  Psychiatric/Behavioral: Negative for confusion, decreased concentration and agitation.      Allergies  Review of patient's allergies indicates no known allergies.  Home Medications   Prior to Admission medications   Medication Sig Start Date End Date Taking? Authorizing Provider  acetaminophen (TYLENOL) 500 MG tablet Take 500 mg by mouth every 4 (four) hours as needed for pain.   Yes Historical Provider, MD  aluminum-magnesium hydroxide-simethicone (MAALOX) 200-200-20 MG/5ML SUSP Take 30 mLs by mouth every 6 (six) hours as needed (indegestion).   Yes Historical Provider, MD  aspirin 81 MG chewable tablet Chew 81 mg by mouth at bedtime.   Yes Historical Provider, MD  diclofenac sodium (VOLTAREN) 1 % GEL Apply 4 g topically 4 (four) times daily as needed (knee pain).   Yes Historical Provider, MD  donepezil (ARICEPT) 10 MG tablet Take 10 mg by mouth at bedtime.   Yes Historical Provider, MD  guaiFENesin (ROBITUSSIN) 100 MG/5ML liquid Take 10 mLs by mouth every 6 (six) hours as needed for cough.    Yes Historical Provider, MD  loperamide (IMODIUM) 2 MG capsule Take 2 mg by mouth 4 (four) times daily as needed for diarrhea or loose stools.   Yes Historical Provider, MD  magnesium hydroxide (MILK OF MAGNESIA) 400 MG/5ML suspension Take 30 mLs by mouth at bedtime as needed for constipation.   Yes Historical Provider, MD  omeprazole (PRILOSEC) 20 MG capsule Take 20 mg by mouth every  evening.   Yes Historical Provider, MD  ondansetron (ZOFRAN) 4 MG tablet Take 4 mg by mouth every 8 (eight) hours as needed for nausea.   Yes Historical Provider, MD  PARoxetine (PAXIL) 40 MG tablet Take 40 mg by mouth daily after breakfast.   Yes Historical Provider, MD  PRESCRIPTION MEDICATION Apply 0.5 mg topically every 6 (six) hours as needed (for agitation). Lorazepam 0.5mg /ml gel   Yes Historical Provider, MD  QUEtiapine (SEROQUEL) 50 MG tablet Take 50 mg by mouth 2 (two) times daily.   Yes Historical Provider, MD  ramipril (ALTACE) 10 MG capsule Take 10 mg by mouth daily before breakfast.   Yes Historical Provider, MD  risperiDONE (RISPERDAL) 1 MG/ML oral solution Take 0.5 mg by mouth at bedtime.   Yes Historical Provider, MD  Vitamin D, Ergocalciferol, (DRISDOL) 50000 UNITS CAPS capsule Take 50,000 Units by mouth every 30 (thirty) days.   Yes Historical Provider, MD   BP 154/88  Pulse 66  Temp(Src) 98.5 F (36.9 C) (Oral)  Resp 17  SpO2 99% Physical Exam  Constitutional: He is oriented to person, place, and time. He appears well-developed and well-nourished. No distress.  HENT:  Head: Normocephalic and atraumatic.  Mouth/Throat: No oropharyngeal exudate.  Eyes: Pupils are equal, round, and reactive to light.  Neck: Normal range of motion. Neck supple.  Cardiovascular: Normal rate, regular rhythm and normal heart sounds.  Exam reveals no gallop and no friction rub.   No murmur heard. Pulmonary/Chest: Effort normal and breath sounds normal. No respiratory distress. He has no wheezes. He has no rales.  Abdominal: Soft. Bowel sounds are normal. He exhibits no distension and no mass. There is no tenderness. There is no rebound and no guarding.  Musculoskeletal: Normal range of motion. He exhibits no edema and no tenderness.  Neurological: He is alert and oriented to person, place, and time. He has normal strength. He displays tremor. No cranial nerve deficit or sensory deficit. GCS eye  subscore is 4. GCS verbal subscore is 4. GCS motor subscore is 6.  Occasional symmetric myoclonic jerking of BLLE and face.   Skin: Skin is warm and dry.  Psychiatric: He has a normal mood and affect.    ED Course  Procedures (including critical care time) Labs Review Labs Reviewed  CBC WITH DIFFERENTIAL - Abnormal; Notable for the following:    WBC 3.9 (*)    RBC 4.20 (*)    Hemoglobin 12.5 (*)    HCT 38.5 (*)    Neutrophils Relative % 41 (*)    Neutro Abs 1.6 (*)    All other components within normal limits  BASIC METABOLIC PANEL - Abnormal; Notable for the following:    GFR calc non Af Amer 47 (*)    GFR calc Af Amer 55 (*)    All other components within normal limits  URINE CULTURE  URINALYSIS, ROUTINE W REFLEX MICROSCOPIC  MAGNESIUM  PHOSPHORUS  AMMONIA  CBC  CREATININE, SERUM  BASIC METABOLIC PANEL  CBC    Imaging Review Ct Head Wo Contrast  08/19/2013   CLINICAL DATA:  Tremors. Seizures new onset. Diabetic. Hypertension. Hyperlipidemia per  EXAM: CT HEAD WITHOUT CONTRAST  TECHNIQUE: Contiguous axial images were obtained from the base of the skull through the vertex without  intravenous contrast.  COMPARISON:  03/03/2013 and 03/01/2012.  FINDINGS: No intracranial hemorrhage.  Small vessel disease type changes. Remote small infarct posterior left coronal radiata. No CT evidence of large acute infarct.  Atrophy global in distribution. Ventricular prominence most notable involving temporal horns greater on right unchanged and felt to be related to atrophy rather than hydrocephalus.  No intracranial mass lesion noted on this unenhanced exam.  Vascular calcifications.  IMPRESSION: Overall no significant change.  No intracranial hemorrhage.  Small vessel disease type changes. Remote small infarct posterior left coronal radiata. No CT evidence of large acute infarct.  Atrophy global in distribution. Ventricular prominence most notable involving temporal horns greater on right  unchanged and felt to be related to atrophy rather than hydrocephalus.  No intracranial mass lesion noted on this unenhanced exam.   Electronically Signed   By: Bridgett Larsson M.D.   On: 08/19/2013 13:08     EKG Interpretation None      Date: 08/19/2013  Rate: 67  Rhythm: normal sinus rhythm  QRS Axis: indeterminate  Intervals: PR prolonged  ST/T Wave abnormalities: nonspecific T wave changes  Conduction Disutrbances:first-degree A-V block   Narrative Interpretation:   Old EKG Reviewed: Has had prior EKG/s with both first degree AVB and complete heart block   MDM   Final diagnoses:  Myoclonic jerking    Pt is a 80 y.o. male with Pmhx as above who presents with shaking from SNF this morning described as generalized w/ nml LOC during episode. On exam, pt does have intermittent myoclonic appearing jerking of arms, legs, face, but retains nml mental status. No focal neuro findings otherwise on neuro exam. He is reportedly at his baseline mental status.  CBC, BMP, UA noncontributory. Mag/phos nml.. CT head w/o acute findings. Neurology consulted & will see pt in the ED. Care transferred to Dr. Silverio Lay who will f/u dispo plans with neurology.       Shanna Cisco, MD 08/19/13 [phone]

## 2013-08-19 NOTE — ED Notes (Signed)
[facility] EMS[facility] came to get pt ready for breakfast. Pt was shaking all over but was conscious and able to ask for medication to stop shaking. Pt did not fall or have any LOC. Pt has progressed alzheimers disease. No hx of seizures. When EMS arrived, pt's arms shaking occasionally, but whole body shaking stopped. Pt at his baseline mentally. BP 134/92, HR 70's, 99% on room air. CBG 111. Pt has not had his morning medications.

## 2013-08-20 ENCOUNTER — Inpatient Hospital Stay (HOSPITAL_COMMUNITY): Payer: Medicare Other

## 2013-08-20 ENCOUNTER — Encounter (HOSPITAL_COMMUNITY): Payer: Self-pay | Admitting: *Deleted

## 2013-08-20 DIAGNOSIS — I10 Essential (primary) hypertension: Secondary | ICD-10-CM

## 2013-08-20 LAB — BASIC METABOLIC PANEL
ANION GAP: 13 (ref 5–15)
BUN: 15 mg/dL (ref 6–23)
CALCIUM: 9.1 mg/dL (ref 8.4–10.5)
CO2: 25 mEq/L (ref 19–32)
CREATININE: 1.22 mg/dL (ref 0.50–1.35)
Chloride: 101 mEq/L (ref 96–112)
GFR calc Af Amer: 61 mL/min — ABNORMAL LOW (ref >90)
GFR, EST NON AFRICAN AMERICAN: 52 mL/min — AB (ref >90)
Glucose, Bld: 92 mg/dL (ref 70–99)
Potassium: 4.4 mEq/L (ref 3.7–5.3)
Sodium: 139 mEq/L (ref 137–147)

## 2013-08-20 LAB — CBC
HCT: 38.7 % — ABNORMAL LOW (ref 39.0–52.0)
Hemoglobin: 12.7 g/dL — ABNORMAL LOW (ref 13.0–17.0)
MCH: 30.1 pg (ref 26.0–34.0)
MCHC: 32.8 g/dL (ref 30.0–36.0)
MCV: 91.7 fL (ref 78.0–100.0)
Platelets: 262 10*3/uL (ref 150–400)
RBC: 4.22 MIL/uL (ref 4.22–5.81)
RDW: 14.9 % (ref 11.5–15.5)
WBC: 4 10*3/uL (ref 4.0–10.5)

## 2013-08-20 LAB — URINE CULTURE

## 2013-08-20 MED ORDER — PNEUMOCOCCAL VAC POLYVALENT 25 MCG/0.5ML IJ INJ
0.5000 mL | INJECTION | INTRAMUSCULAR | Status: DC
  Filled 2013-08-20: qty 0.5

## 2013-08-20 NOTE — Progress Notes (Signed)
EEG Completed; Results Pending  

## 2013-08-20 NOTE — Clinical Social Work Note (Signed)
CSW has made multiple attempts to speak with pt's daughter, Weston BrassKathy Sealy [phone]), regarding pt's discharge disposition. CSW has provided weekend CSW with appropriate handoff to please continue to follow.   Marcelline DeistEmily Suvan Stcyr, MSW, [facility] Licensed Clinical Social Worker [phone] and [phone] [phone]

## 2013-08-20 NOTE — Progress Notes (Signed)
Patient refused to take medications, made several attempts without success, would not allow heparin injection. Crushed meds with applesauce which was also refused.

## 2013-08-20 NOTE — Progress Notes (Signed)
Subjective:  Patient is resting comfortably.   Objective: Current vital signs: BP 186/79  Pulse 85  Temp(Src) 95 F (35 C) (Axillary)  Resp 18  Wt 73.029 kg (161 lb)  SpO2 97% Vital signs in last 24 hours: Temp:  [95 F (35 C)-98.5 F (36.9 C)] 95 F (35 C) (07/31 0600) Pulse Rate:  [63-88] 85 (07/31 0600) Resp:  [12-22] 18 (07/31 0600) BP: (117-186)/(75-114) 186/79 mmHg (07/31 0600) SpO2:  [96 %-100 %] 97 % (07/31 0600) Weight:  [73.029 kg (161 lb)] 73.029 kg (161 lb) (07/30 1830)  Intake/Output from previous day: 07/30 0701 - 07/31 0700 In: 120 [P.O.:120] Out: 300 [Urine:300] Intake/Output this shift:   Nutritional status: Sodium Restricted  Neurologic Exam: General: NAD Mental Status: Alert, not oriented.  Speech fluent without evidence of aphasia.  Able to follow simple commands. Cranial Nerves: II: Visual fields grossly normal, pupils equal, round, reactive to light and accommodation III,IV, VI: ptosis not present, extra-ocular motions intact bilaterally V,VII: smile symmetric, facial light touch sensation normal bilaterally VIII: hearing normal bilaterally IX,X: gag reflex present XI: bilateral shoulder shrug XII: midline tongue extension without atrophy or fasciculations  Motor: Moving all extremities antigravity. Multifocal myoclonus has improved significantly only showing mild twitching of anterior tibialis.  Sensory: Pinprick and light touch intact throughout, bilaterally Deep Tendon Reflexes:  Right: Upper Extremity   Left: Upper extremity   biceps (C-5 to C-6) 2/4   biceps (C-5 to C-6) 2/4 tricep (C7) 2/4    triceps (C7) 2/4 Brachioradialis (C6) 2/4  Brachioradialis (C6) 2/4  Lower Extremity Lower Extremity  quadriceps (L-2 to L-4) 1/4   quadriceps (L-2 to L-4) 1/4 Achilles (S1) 0/4   Achilles (S1) 0/4  Plantars: Right: downgoing   Left: downgoing Cerebellar: normal finger-to-nose,  normal heel-to-shin test    Lab Results: Basic Metabolic  Panel:  Recent Labs Lab 08/19/13 1207 08/20/13 0605  NA 139 139  K 5.2 4.4  CL 102 101  CO2 26 25  GLUCOSE 89 92  BUN 16 15  CREATININE 1.33 1.22  CALCIUM 9.0 9.1  MG 2.2  --   PHOS 3.2  --     Liver Function Tests: No results found for this basename: AST, ALT, ALKPHOS, BILITOT, PROT, ALBUMIN,  in the last 168 hours No results found for this basename: LIPASE, AMYLASE,  in the last 168 hours  Recent Labs Lab 08/19/13 1910  AMMONIA 13    CBC:  Recent Labs Lab 08/19/13 1207 08/20/13 0605  WBC 3.9* 4.0  NEUTROABS 1.6*  --   HGB 12.5* 12.7*  HCT 38.5* 38.7*  MCV 91.7 91.7  PLT 263 262    Cardiac Enzymes: No results found for this basename: CKTOTAL, CKMB, CKMBINDEX, TROPONINI,  in the last 168 hours  Lipid Panel: No results found for this basename: CHOL, TRIG, HDL, CHOLHDL, VLDL, LDLCALC,  in the last 168 hours  CBG: No results found for this basename: GLUCAP,  in the last 168 hours  Microbiology: Results for orders placed during the hospital encounter of 03/03/13  URINE CULTURE     Status: None   Collection Time    03/03/13 11:43 AM      Result Value Ref Range Status   Specimen Description URINE, CLEAN CATCH   Final   Special Requests NONE   Final   Culture  Setup Time     Final   Value: 03/03/2013 11:25     Performed at [facility]   Colony Count  Final   Value: NO GROWTH     Performed at [facility]   Culture     Final   Value: NO GROWTH     Performed at [facility]   Report Status 03/04/2013 FINAL   Final    Coagulation Studies: No results found for this basename: LABPROT, INR,  in the last 72 hours  Imaging: Ct Head Wo Contrast  08/19/2013   CLINICAL DATA:  Tremors. Seizures new onset. Diabetic. Hypertension. Hyperlipidemia per  EXAM: CT HEAD WITHOUT CONTRAST  TECHNIQUE: Contiguous axial images were obtained from the base of the skull through the vertex without intravenous contrast.  COMPARISON:  03/03/2013 and  03/01/2012.  FINDINGS: No intracranial hemorrhage.  Small vessel disease type changes. Remote small infarct posterior left coronal radiata. No CT evidence of large acute infarct.  Atrophy global in distribution. Ventricular prominence most notable involving temporal horns greater on right unchanged and felt to be related to atrophy rather than hydrocephalus.  No intracranial mass lesion noted on this unenhanced exam.  Vascular calcifications.  IMPRESSION: Overall no significant change.  No intracranial hemorrhage.  Small vessel disease type changes. Remote small infarct posterior left coronal radiata. No CT evidence of large acute infarct.  Atrophy global in distribution. Ventricular prominence most notable involving temporal horns greater on right unchanged and felt to be related to atrophy rather than hydrocephalus.  No intracranial mass lesion noted on this unenhanced exam.   Electronically Signed   By: Bridgett Larsson M.D.   On: 08/19/2013 13:08    Medications:  Scheduled: . aspirin  81 mg Oral QHS  . donepezil  10 mg Oral QHS  . heparin  5,000 Units Subcutaneous 3 times per day  . pantoprazole  40 mg Oral Daily  . [START ON 08/21/2013] pneumococcal 23 valent vaccine  0.5 mL Intramuscular Tomorrow-1000  . QUEtiapine  50 mg Oral BID  . ramipril  10 mg Oral QAC breakfast  . risperiDONE  0.5 mg Oral QHS  . sodium chloride  3 mL Intravenous Q12H    Assessment/Plan:  80 YO male who resides at [facility]. The patient was brought to hospital due to bilateral jerking activity. Multifocal myoclonus has improved significantly with Paxil held. Ammonia normal. EEG pending.  Recommend: 1) Not restarting Paxil (likely cause of myoclonus)  2) If EEG is negative no further recommendations.       Felicie Morn PA-C [facility] [phone]  08/20/2013, 11:02 AM

## 2013-08-20 NOTE — Progress Notes (Signed)
[facility] Progress Note   Ronald Perkins ZOX:[id number] DOB: 05/21/1926 DOA: 08/19/2013 PCP: [name], MD  Brief narrative: Ronald Perkins is a 80 y.o. male  with a past medical history of Alzheimer's dementia, hypertension who presents from a nursing facility for jerking of the extremities.    Subjective: Confused. No complaints other than being hungry.   Assessment/Plan: Principal Problem:   Myoclonic jerking - resolve after holding Paxil - EEG results pending- if normal, can return to SNF tomorrow  Active Problems:   Alzheimer's dementia - with behavioral disturbances - will need sitter- keeps trying to climb out of bed    Hypertension - cont Ramipril   Code Status: Full code  Family Communication: none  DVT Prophylaxis:Lovenox   Consultants: none  Procedures: none  Antibiotics: Anti-infectives   None      Objective: Filed Weights   08/19/13 1830  Weight: 73.029 kg (161 lb)    Intake/Output Summary (Last 24 hours) at 08/20/13 1458 Last data filed at 08/20/13 0530  Gross per 24 hour  Intake    120 ml  Output    300 ml  Net   -180 ml     Vitals Filed Vitals:   08/19/13 1955 08/20/13 0200 08/20/13 0600 08/20/13 1447  BP: 148/80 152/83 186/79 84/59  Pulse: 75 88 85 56  Temp:  95 F (35 C) 95 F (35 C) 97.5 F (36.4 C)  TempSrc: Oral Axillary Axillary Axillary  Resp: 18 18 18 20   Height:      Weight:      SpO2: 99% 98% 97% 100%    Exam: General: No acute respiratory distress=- very confused and restless- almost out of bed when I evaluated him Lungs: Clear to auscultation bilaterally without wheezes or crackles Cardiovascular: Regular rate and rhythm without murmur gallop or rub normal S1 and S2 Abdomen: Nontender, nondistended, soft, bowel sounds positive, no rebound, no ascites, no appreciable mass Extremities: No significant cyanosis, clubbing, or edema bilateral lower extremities  Data Reviewed: Basic Metabolic  Panel:  Recent Labs Lab 08/19/13 1207 08/20/13 0605  NA 139 139  K 5.2 4.4  CL 102 101  CO2 26 25  GLUCOSE 89 92  BUN 16 15  CREATININE 1.33 1.22  CALCIUM 9.0 9.1  MG 2.2  --   PHOS 3.2  --    Liver Function Tests: No results found for this basename: AST, ALT, ALKPHOS, BILITOT, PROT, ALBUMIN,  in the last 168 hours No results found for this basename: LIPASE, AMYLASE,  in the last 168 hours  Recent Labs Lab 08/19/13 1910  AMMONIA 13   CBC:  Recent Labs Lab 08/19/13 1207 08/20/13 0605  WBC 3.9* 4.0  NEUTROABS 1.6*  --   HGB 12.5* 12.7*  HCT 38.5* 38.7*  MCV 91.7 91.7  PLT 263 262   Cardiac Enzymes: No results found for this basename: CKTOTAL, CKMB, CKMBINDEX, TROPONINI,  in the last 168 hours BNP (last 3 results) No results found for this basename: PROBNP,  in the last 8760 hours CBG: No results found for this basename: GLUCAP,  in the last 168 hours  Recent Results (from the past 240 hour(s))  URINE CULTURE     Status: None   Collection Time    08/19/13 12:10 PM      Result Value Ref Range Status   Specimen Description URINE, CLEAN CATCH   Final   Special Requests NONE   Final   Culture  Setup Time  Final   Value: 08/19/2013 16:03     Performed at [facility]   Colony Count     Final   Value: 55,000 COLONIES/ML     Performed at [facility]   Culture     Final   Value: Multiple bacterial morphotypes present, none predominant. Suggest appropriate recollection if clinically indicated.     Performed at [facility]   Report Status 08/20/2013 FINAL   Final     Studies:  Recent x-ray studies have been reviewed in detail by the Attending Physician  Scheduled Meds:  Scheduled Meds: . aspirin  81 mg Oral QHS  . donepezil  10 mg Oral QHS  . heparin  5,000 Units Subcutaneous 3 times per day  . pantoprazole  40 mg Oral Daily  . [START ON 08/21/2013] pneumococcal 23 valent vaccine  0.5 mL Intramuscular Tomorrow-1000  .  QUEtiapine  50 mg Oral BID  . ramipril  10 mg Oral QAC breakfast  . risperiDONE  0.5 mg Oral QHS  . sodium chloride  3 mL Intravenous Q12H   Continuous Infusions:   Time spent on care of this patient: 35 min   Rechy Bost, MD 08/20/2013, 2:58 PM  LOS: 1 day   [facility] Office  [phone] Pager - Text Page per [email or web address]  If 7PM-7AM, please contact night-coverage [email or web address]

## 2013-08-20 NOTE — Procedures (Signed)
ELECTROENCEPHALOGRAM REPORT   Patient: Ronald Perkins       Room #: 1O104N19 EEG No. ID: [id number] Age: 80 y.o.        Sex: male Referring Physician: Rizwan Report Date:  08/20/2013        Interpreting Physician: Thana FarrEYNOLDS,Ellissa Ayo D  History: Ronald DittoFrank J Elizardo is an 80 y.o. male presenting with multifocal myoclonus  Medications:  Scheduled: . aspirin  81 mg Oral QHS  . donepezil  10 mg Oral QHS  . heparin  5,000 Units Subcutaneous 3 times per day  . pantoprazole  40 mg Oral Daily  . [START ON 08/21/2013] pneumococcal 23 valent vaccine  0.5 mL Intramuscular Tomorrow-1000  . QUEtiapine  50 mg Oral BID  . ramipril  10 mg Oral QAC breakfast  . risperiDONE  0.5 mg Oral QHS  . sodium chloride  3 mL Intravenous Q12H    Conditions of Recording:  This is a 16 channel EEG carried out with the patient in the drowsy and asleep states.  Description:  The patient did not achieve full wakefulness during the tracing for a waking background rhythm to be evaluated.   The patient drowses with slow, irregular activity consisting of a moxtue low voltage theta and delta activity with some faster rhythms noted on occasion.   The patient goes in to a light sleep with symmetrical sleep spindles, vertex central sharp transients and irregular slow activity.   Hyperventilation and intermittent photic stimulation were not performed.  IMPRESSION: Normal drowsy and asleep electroencephalogram.  No epileptiform activity is noted.   Thana FarrLeslie Ilisha Blust, MD [facility] [phone] 08/20/2013, 6:58 PM

## 2013-08-21 NOTE — Progress Notes (Signed)
Subjective: Patient resting but easily awakened.  No further myoclonus noted but patient does continue with tremor.    Objective: Current vital signs: BP 159/75  Pulse 72  Temp(Src) 97.6 F (36.4 C) (Oral)  Resp 20  Ht 5\' 11"  (1.803 m)  Wt 73.029 kg (161 lb)  BMI 22.46 kg/m2  SpO2 98% Vital signs in last 24 hours: Temp:  [97.4 F (36.3 C)-98 F (36.7 C)] 97.6 F (36.4 C) (08/01 0941) Pulse Rate:  [56-102] 72 (08/01 0941) Resp:  [18-20] 20 (08/01 0941) BP: (84-159)/(51-82) 159/75 mmHg (08/01 0941) SpO2:  [98 %-100 %] 98 % (08/01 0941)  Intake/Output from previous day:   Intake/Output this shift:   Nutritional status: Sodium Restricted  Neurologic Exam: Mental Status:  Alert, not oriented. Speech fluent without evidence of aphasia. Able to follow simple commands.  Cranial Nerves:  II: Visual fields grossly normal, pupils equal, round, reactive to light and accommodation  III,IV, VI: ptosis not present, extra-ocular motions intact bilaterally  V,VII: smile symmetric, facial light touch sensation normal bilaterally  VIII: hearing normal bilaterally  IX,X: gag reflex present  XI: bilateral shoulder shrug  XII: midline tongue extension without atrophy or fasciculations  Motor:  Moving all extremities antigravity. Intention tremor noted in the upper extremities. Sensory: Pinprick and light touch intact throughout, bilaterally  Deep Tendon Reflexes:  2+ in the upper extremities, 1+ at the knees and absent at the ankles Plantars:  Right: downgoing    Left: downgoing   Lab Results: Basic Metabolic Panel:  Recent Labs Lab 08/19/13 1207 08/20/13 0605  NA 139 139  K 5.2 4.4  CL 102 101  CO2 26 25  GLUCOSE 89 92  BUN 16 15  CREATININE 1.33 1.22  CALCIUM 9.0 9.1  MG 2.2  --   PHOS 3.2  --     Liver Function Tests: No results found for this basename: AST, ALT, ALKPHOS, BILITOT, PROT, ALBUMIN,  in the last 168 hours No results found for this basename: LIPASE,  AMYLASE,  in the last 168 hours  Recent Labs Lab 08/19/13 1910  AMMONIA 13    CBC:  Recent Labs Lab 08/19/13 1207 08/20/13 0605  WBC 3.9* 4.0  NEUTROABS 1.6*  --   HGB 12.5* 12.7*  HCT 38.5* 38.7*  MCV 91.7 91.7  PLT 263 262    Cardiac Enzymes: No results found for this basename: CKTOTAL, CKMB, CKMBINDEX, TROPONINI,  in the last 168 hours  Lipid Panel: No results found for this basename: CHOL, TRIG, HDL, CHOLHDL, VLDL, LDLCALC,  in the last 168 hours  CBG: No results found for this basename: GLUCAP,  in the last 168 hours  Microbiology: Results for orders placed during the hospital encounter of 08/19/13  URINE CULTURE     Status: None   Collection Time    08/19/13 12:10 PM      Result Value Ref Range Status   Specimen Description URINE, CLEAN CATCH   Final   Special Requests NONE   Final   Culture  Setup Time     Final   Value: 08/19/2013 16:03     Performed at [facility] Count     Final   Value: 55,000 COLONIES/ML     Performed at [facility]   Culture     Final   Value: Multiple bacterial morphotypes present, none predominant. Suggest appropriate recollection if clinically indicated.     Performed at [facility]   Report Status 08/20/2013  FINAL   Final    Coagulation Studies: No results found for this basename: LABPROT, INR,  in the last 72 hours  Imaging: Ct Head Wo Contrast  08/19/2013   CLINICAL DATA:  Tremors. Seizures new onset. Diabetic. Hypertension. Hyperlipidemia per  EXAM: CT HEAD WITHOUT CONTRAST  TECHNIQUE: Contiguous axial images were obtained from the base of the skull through the vertex without intravenous contrast.  COMPARISON:  03/03/2013 and 03/01/2012.  FINDINGS: No intracranial hemorrhage.  Small vessel disease type changes. Remote small infarct posterior left coronal radiata. No CT evidence of large acute infarct.  Atrophy global in distribution. Ventricular prominence most notable involving temporal  horns greater on right unchanged and felt to be related to atrophy rather than hydrocephalus.  No intracranial mass lesion noted on this unenhanced exam.  Vascular calcifications.  IMPRESSION: Overall no significant change.  No intracranial hemorrhage.  Small vessel disease type changes. Remote small infarct posterior left coronal radiata. No CT evidence of large acute infarct.  Atrophy global in distribution. Ventricular prominence most notable involving temporal horns greater on right unchanged and felt to be related to atrophy rather than hydrocephalus.  No intracranial mass lesion noted on this unenhanced exam.   Electronically Signed   By: Bridgett LarssonSteve  Olson M.D.   On: 08/19/2013 13:08    Medications:  I have reviewed the patient's current medications. Scheduled: . aspirin  81 mg Oral QHS  . donepezil  10 mg Oral QHS  . heparin  5,000 Units Subcutaneous 3 times per day  . pantoprazole  40 mg Oral Daily  . pneumococcal 23 valent vaccine  0.5 mL Intramuscular Tomorrow-1000  . QUEtiapine  50 mg Oral BID  . ramipril  10 mg Oral QAC breakfast  . risperiDONE  0.5 mg Oral QHS  . sodium chloride  3 mL Intravenous Q12H    Assessment/Plan: Myoclonus appears to have resolved.  Patient does have tremor and unclear if this is baseline.  EEG unremarkable.  Would not start medications for tremor at this time since this will likely negatively impact mental status.  Likely some influence from medications, namely the antipsychotics which the patient needs.    Recommendations: 1.  Would continue off Paxil and be cautious about the use of other serotonin agents. 2.  No further neurologic intervention is recommended at this time.  If further questions arise, please call or page at that time.  Thank you for allowing neurology to participate in the care of this patient.  Case discussed with Dr. Butler Denmarkizwan    LOS: 2 days   Thana FarrLeslie Mallory Schaad, MD [facility] [phone] 08/21/2013  10:08 AM

## 2013-08-21 NOTE — Progress Notes (Signed)
Report called to receiving nurse [facility]. Awaiting [facility] to transport. IV site d/c'd per protocol.

## 2013-08-21 NOTE — Discharge Summary (Addendum)
Physician Discharge Summary  Ronald Perkins ZOX:[id number] DOB: 05/21/1926 DOA: 08/19/2013  PCP: [name], MD  Admit date: 08/19/2013 Discharge date: 08/21/2013  Time spent: >45 minutes  Recommendations for Outpatient Follow-up:  1. Avoid SSRIs   Discharge Diagnoses:  Principal Problem:   Myoclonic jerking Active Problems:   Alzheimer's dementia   Hypertension   Discharge Condition: stable  Diet recommendation: low sodium  Filed Weights   08/19/13 1830  Weight: 73.029 kg (161 lb)    History of present illness:  Ronald Perkins is a 81 y.o. male with a past medical history of Alzheimer's dementia, hypertension who presents from a nursing facility for jerking of the extremities.   Hospital Course:  Principal Problem:  Myoclonic jerking  - frequent jerking of torso and extremities noted on admission - Neuro consulted by ER and recommendations were to stop Paxil - symptoms were noted to resolve after holding Paxil - avoid SSRIs from here on - Neuro also notes that he has a mild intention tremor- we are not certain if this is his baseline but at this time, will not be initiating new medications - EEG resulted- Normal drowsy and asleep electroencephalogram. No epileptiform activity is noted.  - can return to SNF today   Active Problems:  Alzheimer's dementia  - with behavioral disturbances   Hypertension  - cont Ramipril   Procedures:  EEG 7/31  Consultations:  Neuro  Discharge Exam: Filed Vitals:   08/21/13 0941  BP: 159/75  Pulse: 72  Temp: 97.6 F (36.4 C)  Resp: 20   General: No acute respiratory distress- confused to place and situation and restless at times  Lungs: Clear to auscultation bilaterally without wheezes or crackles  Cardiovascular: Regular rate and rhythm without murmur gallop or rub normal S1 and S2  Abdomen: Nontender, nondistended, soft, bowel sounds positive, no rebound, no ascites, no appreciable mass  Extremities: No significant  cyanosis, clubbing, or edema bilateral lower extremities   Discharge Instructions You were cared for by a hospitalist during your hospital stay. If you have any questions about your discharge medications or the care you received while you were in the hospital after you are discharged, you can call the unit and asked to speak with the hospitalist on call if the hospitalist that took care of you is not available. Once you are discharged, your primary care physician will handle any further medical issues. Please note that NO REFILLS for any discharge medications will be authorized once you are discharged, as it is imperative that you return to your primary care physician (or establish a relationship with a primary care physician if you do not have one) for your aftercare needs so that they can reassess your need for medications and monitor your lab values.      Discharge Instructions   Diet - low sodium heart healthy    Complete by:  As directed      Increase activity slowly    Complete by:  As directed             Medication List    STOP taking these medications       PARoxetine 40 MG tablet  Commonly known as:  PAXIL      TAKE these medications       acetaminophen 500 MG tablet  Commonly known as:  TYLENOL  Take 500 mg by mouth every 4 (four) hours as needed for pain.     aluminum-magnesium hydroxide-simethicone 200-200-20 MG/5ML Susp  Commonly known as:  MAALOX  Take 30 mLs by mouth every 6 (six) hours as needed (indegestion).     aspirin 81 MG chewable tablet  Chew 81 mg by mouth at bedtime.     diclofenac sodium 1 % Gel  Commonly known as:  VOLTAREN  Apply 4 g topically 4 (four) times daily as needed (knee pain).     donepezil 10 MG tablet  Commonly known as:  ARICEPT  Take 10 mg by mouth at bedtime.     guaiFENesin 100 MG/5ML liquid  Commonly known as:  ROBITUSSIN  Take 10 mLs by mouth every 6 (six) hours as needed for cough.     loperamide 2 MG capsule  Commonly  known as:  IMODIUM  Take 2 mg by mouth 4 (four) times daily as needed for diarrhea or loose stools.     magnesium hydroxide 400 MG/5ML suspension  Commonly known as:  MILK OF MAGNESIA  Take 30 mLs by mouth at bedtime as needed for constipation.     omeprazole 20 MG capsule  Commonly known as:  PRILOSEC  Take 20 mg by mouth every evening.     ondansetron 4 MG tablet  Commonly known as:  ZOFRAN  Take 4 mg by mouth every 8 (eight) hours as needed for nausea.     PRESCRIPTION MEDICATION  Apply 0.5 mg topically every 6 (six) hours as needed (for agitation). Lorazepam 0.5mg /ml gel     QUEtiapine 50 MG tablet  Commonly known as:  SEROQUEL  Take 50 mg by mouth 2 (two) times daily.     ramipril 10 MG capsule  Commonly known as:  ALTACE  Take 10 mg by mouth daily before breakfast.     risperiDONE 1 MG/ML oral solution  Commonly known as:  RISPERDAL  Take 0.5 mg by mouth at bedtime.     Vitamin D (Ergocalciferol) 50000 UNITS Caps capsule  Commonly known as:  DRISDOL  Take 50,000 Units by mouth every 30 (thirty) days.       No Known Allergies    The results of significant diagnostics from this hospitalization (including imaging, microbiology, ancillary and laboratory) are listed below for reference.    Significant Diagnostic Studies: Ct Head Wo Contrast  08/19/2013   CLINICAL DATA:  Tremors. Seizures new onset. Diabetic. Hypertension. Hyperlipidemia per  EXAM: CT HEAD WITHOUT CONTRAST  TECHNIQUE: Contiguous axial images were obtained from the base of the skull through the vertex without intravenous contrast.  COMPARISON:  03/03/2013 and 03/01/2012.  FINDINGS: No intracranial hemorrhage.  Small vessel disease type changes. Remote small infarct posterior left coronal radiata. No CT evidence of large acute infarct.  Atrophy global in distribution. Ventricular prominence most notable involving temporal horns greater on right unchanged and felt to be related to atrophy rather than  hydrocephalus.  No intracranial mass lesion noted on this unenhanced exam.  Vascular calcifications.  IMPRESSION: Overall no significant change.  No intracranial hemorrhage.  Small vessel disease type changes. Remote small infarct posterior left coronal radiata. No CT evidence of large acute infarct.  Atrophy global in distribution. Ventricular prominence most notable involving temporal horns greater on right unchanged and felt to be related to atrophy rather than hydrocephalus.  No intracranial mass lesion noted on this unenhanced exam.   Electronically Signed   By: Bridgett LarssonSteve  Olson M.D.   On: 08/19/2013 13:08    Microbiology: Recent Results (from the past 240 hour(s))  URINE CULTURE     Status: None   Collection Time  08/19/13 12:10 PM      Result Value Ref Range Status   Specimen Description URINE, CLEAN CATCH   Final   Special Requests NONE   Final   Culture  Setup Time     Final   Value: 08/19/2013 16:03     Performed at [facility]   Colony Count     Final   Value: 55,000 COLONIES/ML     Performed at [facility]   Culture     Final   Value: Multiple bacterial morphotypes present, none predominant. Suggest appropriate recollection if clinically indicated.     Performed at [facility]   Report Status 08/20/2013 FINAL   Final     Labs: Basic Metabolic Panel:  Recent Labs Lab 08/19/13 1207 08/20/13 0605  NA 139 139  K 5.2 4.4  CL 102 101  CO2 26 25  GLUCOSE 89 92  BUN 16 15  CREATININE 1.33 1.22  CALCIUM 9.0 9.1  MG 2.2  --   PHOS 3.2  --    Liver Function Tests: No results found for this basename: AST, ALT, ALKPHOS, BILITOT, PROT, ALBUMIN,  in the last 168 hours No results found for this basename: LIPASE, AMYLASE,  in the last 168 hours  Recent Labs Lab 08/19/13 1910  AMMONIA 13   CBC:  Recent Labs Lab 08/19/13 1207 08/20/13 0605  WBC 3.9* 4.0  NEUTROABS 1.6*  --   HGB 12.5* 12.7*  HCT 38.5* 38.7*  MCV 91.7 91.7  PLT 263 262    Cardiac Enzymes: No results found for this basename: CKTOTAL, CKMB, CKMBINDEX, TROPONINI,  in the last 168 hours BNP: BNP (last 3 results) No results found for this basename: PROBNP,  in the last 8760 hours CBG: No results found for this basename: GLUCAP,  in the last 168 hours     Signed:  Calvert Cantor, MD  [facility] 08/21/2013, 10:28 AM

## 2013-08-21 NOTE — Progress Notes (Signed)
Clinical Social Work Department BRIEF PSYCHOSOCIAL ASSESSMENT 08/21/2013  Patient:  Cheree DittoBENNETT,Kerim J     Account Number:  [id number]     Admit date:  08/19/2013  Clinical Social Worker:  Hendricks MiloMORGAN,Juanice Warburton, LCSWA  Date/Time:  08/21/2013 05:22 PM  Referred by:  Physician  Date Referred:  08/20/2013 Referred for  ALF Placement   Other Referral:   Interview type:  Family Other interview type:    PSYCHOSOCIAL DATA Living Status:  FACILITY Ad[facility] Level of care:  Assisted Living Primary support name:  Weston BrassKathy Sealy Primary support relationship to patient:  CHILD, ADULT Degree of support available:   Good support.    CURRENT CONCERNS  Other Concerns:    SOCIAL WORK ASSESSMENT / PLAN Clinical Social Worker (CSW) contacted patient's daughter Weston BrassKathy Sealy to discuss D/C plan. Per daughter [facility]. Patient has been living there for 3 years. Daughter is agreeable for [facility] to [facility]. Daughter requested that patient use EMS for transport.   Assessment/plan status:  Psychosocial Support/Ongoing Assessment of Needs Other assessment/ plan:   Information/referral to community resources:    PATIENT'S/FAMILY'S RESPONSE TO PLAN OF CARE: Daughter thanked CSW for calling and assisting with return back to ALF.

## 2013-08-21 NOTE — Progress Notes (Signed)
Patient is medically stable to D/[facility]. Per [place] RN at [facility] can return today. Clinical Child psychotherapistocial Worker (CSW) prepared D/C packet and faxed D/C summary to ALF. CSW contacted patient's daughter Weston BrassKathy Sealy and made her aware of above. Per daughter's request CSW arranged EMS for transport. Nursing is aware of above. Please reconsult if future social work needs arise. CSW signing off.   Jetta LoutBailey Morgan, LCSWA Weekend CSW [phone]

## 2013-09-25 ENCOUNTER — Emergency Department (HOSPITAL_COMMUNITY): Payer: Medicare Other

## 2013-09-25 ENCOUNTER — Emergency Department (HOSPITAL_COMMUNITY)
Admission: EM | Admit: 2013-09-25 | Discharge: 2013-09-25 | Disposition: A | Discharge status: Home / Self Care | Payer: Medicare Other | Attending: Emergency Medicine | Admitting: Emergency Medicine

## 2013-09-25 ENCOUNTER — Encounter (HOSPITAL_COMMUNITY): Payer: Self-pay | Admitting: Emergency Medicine

## 2013-09-25 DIAGNOSIS — K219 Gastro-esophageal reflux disease without esophagitis: Secondary | ICD-10-CM | POA: Insufficient documentation

## 2013-09-25 DIAGNOSIS — G309 Alzheimer's disease, unspecified: Secondary | ICD-10-CM | POA: Diagnosis not present

## 2013-09-25 DIAGNOSIS — R5381 Other malaise: Secondary | ICD-10-CM | POA: Insufficient documentation

## 2013-09-25 DIAGNOSIS — F0280 Dementia in other diseases classified elsewhere without behavioral disturbance: Secondary | ICD-10-CM | POA: Diagnosis not present

## 2013-09-25 DIAGNOSIS — G253 Myoclonus: Secondary | ICD-10-CM | POA: Insufficient documentation

## 2013-09-25 DIAGNOSIS — Z8639 Personal history of other endocrine, nutritional and metabolic disease: Secondary | ICD-10-CM | POA: Diagnosis not present

## 2013-09-25 DIAGNOSIS — Z862 Personal history of diseases of the blood and blood-forming organs and certain disorders involving the immune mechanism: Secondary | ICD-10-CM | POA: Insufficient documentation

## 2013-09-25 DIAGNOSIS — I10 Essential (primary) hypertension: Secondary | ICD-10-CM | POA: Diagnosis not present

## 2013-09-25 DIAGNOSIS — R011 Cardiac murmur, unspecified: Secondary | ICD-10-CM | POA: Diagnosis not present

## 2013-09-25 DIAGNOSIS — Z7982 Long term (current) use of aspirin: Secondary | ICD-10-CM | POA: Insufficient documentation

## 2013-09-25 DIAGNOSIS — R5383 Other fatigue: Secondary | ICD-10-CM

## 2013-09-25 DIAGNOSIS — Z79899 Other long term (current) drug therapy: Secondary | ICD-10-CM | POA: Insufficient documentation

## 2013-09-25 LAB — PROTIME-INR
INR: 1.1 (ref 0.00–1.49)
Prothrombin Time: 14.2 seconds (ref 11.6–15.2)

## 2013-09-25 LAB — CBC WITH DIFFERENTIAL/PLATELET
Basophils Absolute: 0 10*3/uL (ref 0.0–0.1)
Basophils Relative: 1 % (ref 0–1)
Eosinophils Absolute: 0.1 10*3/uL (ref 0.0–0.7)
Eosinophils Relative: 4 % (ref 0–5)
HCT: 37 % — ABNORMAL LOW (ref 39.0–52.0)
HEMOGLOBIN: 12.1 g/dL — AB (ref 13.0–17.0)
LYMPHS ABS: 0.9 10*3/uL (ref 0.7–4.0)
Lymphocytes Relative: 37 % (ref 12–46)
MCH: 29.4 pg (ref 26.0–34.0)
MCHC: 32.7 g/dL (ref 30.0–36.0)
MCV: 89.8 fL (ref 78.0–100.0)
MONOS PCT: 7 % (ref 3–12)
Monocytes Absolute: 0.2 10*3/uL (ref 0.1–1.0)
NEUTROS ABS: 1.3 10*3/uL — AB (ref 1.7–7.7)
NEUTROS PCT: 51 % (ref 43–77)
PLATELETS: 261 10*3/uL (ref 150–400)
RBC: 4.12 MIL/uL — AB (ref 4.22–5.81)
RDW: 14.9 % (ref 11.5–15.5)
WBC: 2.6 10*3/uL — ABNORMAL LOW (ref 4.0–10.5)

## 2013-09-25 LAB — COMPREHENSIVE METABOLIC PANEL
ALT: 9 U/L (ref 0–53)
ANION GAP: 12 (ref 5–15)
AST: 17 U/L (ref 0–37)
Albumin: 3.2 g/dL — ABNORMAL LOW (ref 3.5–5.2)
Alkaline Phosphatase: 142 U/L — ABNORMAL HIGH (ref 39–117)
BILIRUBIN TOTAL: 0.5 mg/dL (ref 0.3–1.2)
BUN: 16 mg/dL (ref 6–23)
CHLORIDE: 102 meq/L (ref 96–112)
CO2: 25 mEq/L (ref 19–32)
Calcium: 9 mg/dL (ref 8.4–10.5)
Creatinine, Ser: 1.26 mg/dL (ref 0.50–1.35)
GFR calc Af Amer: 58 mL/min — ABNORMAL LOW (ref >90)
GFR calc non Af Amer: 50 mL/min — ABNORMAL LOW (ref >90)
Glucose, Bld: 125 mg/dL — ABNORMAL HIGH (ref 70–99)
Potassium: 4.1 mEq/L (ref 3.7–5.3)
SODIUM: 139 meq/L (ref 137–147)
Total Protein: 8 g/dL (ref 6.0–8.3)

## 2013-09-25 LAB — URINE MICROSCOPIC-ADD ON

## 2013-09-25 LAB — URINALYSIS, ROUTINE W REFLEX MICROSCOPIC
Bilirubin Urine: NEGATIVE
GLUCOSE, UA: NEGATIVE mg/dL
KETONES UR: 15 mg/dL — AB
NITRITE: NEGATIVE
Protein, ur: 30 mg/dL — AB
SPECIFIC GRAVITY, URINE: 1.008 (ref 1.005–1.030)
Urobilinogen, UA: 1 mg/dL (ref 0.0–1.0)
pH: 7.5 (ref 5.0–8.0)

## 2013-09-25 LAB — TROPONIN I: Troponin I: <0.3 ng/mL (ref <0.30)

## 2013-09-25 MED ORDER — SODIUM CHLORIDE 0.9 % IV BOLUS (SEPSIS)
500.0000 mL | Freq: Once | INTRAVENOUS | Status: AC
  Administered 2013-09-25: 500 mL via INTRAVENOUS

## 2013-09-25 NOTE — ED Notes (Signed)
Patient transported to CT 

## 2013-09-25 NOTE — ED Provider Notes (Signed)
CSN: [id number]     Arrival date & time 09/25/13  8295 History   First MD Initiated Contact with Patient 09/25/13 [phone]     Chief Complaint  Patient presents with  . Weakness     (Consider location/radiation/quality/duration/timing/severity/associated sxs/prior Treatment) Patient is a 80 y.o. male presenting with neurologic complaint. The history is provided by the patient.  Neurologic Problem This is a new problem. The current episode started 1 to 2 hours ago. The problem occurs constantly. The problem has been resolved. Nothing aggravates the symptoms. Nothing relieves the symptoms. He has tried nothing for the symptoms. The treatment provided significant relief.    Past Medical History  Diagnosis Date  . Dementia   . Hypertension   . GERD (gastroesophageal reflux disease)   . Hyperlipidemia   . Alzheimer disease    History reviewed. No pertinent past surgical history. No family history on file. History  Substance Use Topics  . Smoking status: Never Smoker   . Smokeless tobacco: Never Used  . Alcohol Use: No    Review of Systems  Constitutional: Negative for fever.  HENT: Negative for drooling and rhinorrhea.   Eyes: Negative for pain.  Respiratory: Negative for cough.   Cardiovascular: Negative for leg swelling.  Gastrointestinal: Negative for nausea, vomiting and diarrhea.  Genitourinary: Negative for dysuria and hematuria.  Musculoskeletal: Negative for gait problem and neck pain.  Skin: Negative for color change.  Neurological: Negative for numbness.  Hematological: Negative for adenopathy.  Psychiatric/Behavioral: Negative for behavioral problems.  All other systems reviewed and are negative.     Allergies  Review of patient's allergies indicates no known allergies.  Home Medications   Prior to Admission medications   Medication Sig Start Date End Date Taking? Authorizing Provider  acetaminophen (TYLENOL) 500 MG tablet Take 500 mg by mouth every 4 (four)  hours as needed for pain.    Historical Provider, MD  aluminum-magnesium hydroxide-simethicone (MAALOX) 200-200-20 MG/5ML SUSP Take 30 mLs by mouth every 6 (six) hours as needed (indegestion).    Historical Provider, MD  aspirin 81 MG chewable tablet Chew 81 mg by mouth at bedtime.    Historical Provider, MD  diclofenac sodium (VOLTAREN) 1 % GEL Apply 4 g topically 4 (four) times daily as needed (knee pain).    Historical Provider, MD  donepezil (ARICEPT) 10 MG tablet Take 10 mg by mouth at bedtime.    Historical Provider, MD  guaiFENesin (ROBITUSSIN) 100 MG/5ML liquid Take 10 mLs by mouth every 6 (six) hours as needed for cough.     Historical Provider, MD  loperamide (IMODIUM) 2 MG capsule Take 2 mg by mouth 4 (four) times daily as needed for diarrhea or loose stools.    Historical Provider, MD  magnesium hydroxide (MILK OF MAGNESIA) 400 MG/5ML suspension Take 30 mLs by mouth at bedtime as needed for constipation.    Historical Provider, MD  omeprazole (PRILOSEC) 20 MG capsule Take 20 mg by mouth every evening.    Historical Provider, MD  ondansetron (ZOFRAN) 4 MG tablet Take 4 mg by mouth every 8 (eight) hours as needed for nausea.    Historical Provider, MD  PRESCRIPTION MEDICATION Apply 0.5 mg topically every 6 (six) hours as needed (for agitation). Lorazepam 0.5mg /ml gel    Historical Provider, MD  QUEtiapine (SEROQUEL) 50 MG tablet Take 50 mg by mouth 2 (two) times daily.    Historical Provider, MD  ramipril (ALTACE) 10 MG capsule Take 10 mg by mouth daily before breakfast.  Historical Provider, MD  risperiDONE (RISPERDAL) 1 MG/ML oral solution Take 0.5 mg by mouth at bedtime.    Historical Provider, MD  Vitamin D, Ergocalciferol, (DRISDOL) 50000 UNITS CAPS capsule Take 50,000 Units by mouth every 30 (thirty) days.    Historical Provider, MD   BP 112/56  Pulse 77  Resp 16  SpO2 100% Physical Exam  Nursing note and vitals reviewed. Constitutional: He appears well-developed and  well-nourished.  HENT:  Head: Normocephalic and atraumatic.  Right Ear: External ear normal.  Left Ear: External ear normal.  Nose: Nose normal.  Mouth/Throat: Oropharynx is clear and moist. No oropharyngeal exudate.  Eyes: Conjunctivae and EOM are normal. Pupils are equal, round, and reactive to light.  Neck: Normal range of motion. Neck supple.  Cardiovascular: Normal rate, regular rhythm and intact distal pulses.  Exam reveals no gallop and no friction rub.   Murmur heard. Pulmonary/Chest: Effort normal and breath sounds normal. No respiratory distress. He has no wheezes.  Abdominal: Soft. Bowel sounds are normal. He exhibits no distension. There is no tenderness. There is no rebound and no guarding.  Genitourinary: Penis normal.  Musculoskeletal: Normal range of motion. He exhibits no edema and no tenderness.  Neurological: He is alert.  alert, oriented x2, does not know year speech: normal in context and clarity memory: intact grossly cranial nerves II-XII: intact motor strength: full proximally and distally Mild intention tremor in upper and lower extremities sensation: intact to light touch diffusely  cerebellar: finger-to-nose intact bilaterally gait: non-ambulatory at baseline   Skin: Skin is warm and dry.  Psychiatric: He has a normal mood and affect. His behavior is normal.    ED Course  Procedures (including critical care time) Labs Review Labs Reviewed  CBC WITH DIFFERENTIAL - Abnormal; Notable for the following:    WBC 2.6 (*)    RBC 4.12 (*)    Hemoglobin 12.1 (*)    HCT 37.0 (*)    Neutro Abs 1.3 (*)    All other components within normal limits  COMPREHENSIVE METABOLIC PANEL - Abnormal; Notable for the following:    Glucose, Bld 125 (*)    Albumin 3.2 (*)    Alkaline Phosphatase 142 (*)    GFR calc non Af Amer 50 (*)    GFR calc Af Amer 58 (*)    All other components within normal limits  URINALYSIS, ROUTINE W REFLEX MICROSCOPIC - Abnormal; Notable for  the following:    Color, Urine RED (*)    APPearance CLOUDY (*)    Hgb urine dipstick LARGE (*)    Ketones, ur 15 (*)    Protein, ur 30 (*)    Leukocytes, UA SMALL (*)    All other components within normal limits  TROPONIN I  PROTIME-INR  URINE MICROSCOPIC-ADD ON    Imaging Review Dg Chest 2 View  09/25/2013   CLINICAL DATA:  Altered mental status  EXAM: CHEST  2 VIEW  COMPARISON:  03/03/2013  FINDINGS: Low lung volumes. Bilateral basilar subsegmental atelectasis. Mild cardiomegaly. Normal vascularity. Postoperative changes. No acute bony deformity.  IMPRESSION: Bibasilar atelectasis.   Electronically Signed   By: Maryclare Bean M.D.   On: 09/25/2013 11:59   Ct Head Wo Contrast  09/25/2013   CLINICAL DATA:  Altered mental status.  Incoherent thoughts.  EXAM: CT HEAD WITHOUT CONTRAST  TECHNIQUE: Contiguous axial images were obtained from the base of the skull through the vertex without intravenous contrast.  COMPARISON:  08/19/2013  FINDINGS: Global atrophy is  stable. Chronic ischemic changes in the periventricular white matter, external capsules, left anterior internal capsule and brainstem are stable. No mass effect, midline shift, or acute hemorrhage. Mastoid air cells are clear.  IMPRESSION: No acute intracranial pathology.  Chronic changes.   Electronically Signed   By: Maryclare Bean M.D.   On: 09/25/2013 10:26     EKG Interpretation   Date/Time:  Saturday September 25 2013 09:30:31 EDT Ventricular Rate:  81 PR Interval:  322 QRS Duration: 162 QT Interval:  490 QTC Calculation: 569 R Axis:   3 Text Interpretation:  Age not entered, assumed to be  80 years old for  purpose of ECG interpretation Sinus or ectopic atrial rhythm Prolonged PR  interval Right bundle branch block Inferior infarct, age indeterminate No  significant change since last tracing Confirmed by Heylee Tant  MD, Azriella Mattia  ([id number]) on 09/25/2013 9:42:01 AM      MDM   Final diagnoses:  Myoclonic jerking    9:38 AM 80 y.o.  male w hx of advanced dementia/alzheimers, HTN who presents with generalized weakness. He apparently was last seen normal at 9 PM last night. This morning upon awakening he complained of generalized weakness. EMS reports that upon taking the patient to breakfast he became tremulous and was incoherent. On exam currently he is alert and oriented x2. He is interactive. He has no focal neurologic deficits on my exam. He is bedbound. Of note he was admitted here in July '15 for evaluation of myoclonic jerking. He was seen by neurology and had a noncontributory EEG. It was thought that his symptoms were related to a medication he was taking, Paxil. Once this med was discontinued the symptoms resolved. He was noted to have an ongoing mild intention tremor. He has this mild intention tremor on my exam currently. He has no specific complaints, he states that he does not feel well. He is afebrile and vital signs are unremarkable. He otherwise appears well. Will get screening labs and imaging.  I spoke w/ his med tech Art gallery manager) at his facility. She states that he was having jerking movements of his extremities this morning which sounds similar to his previous episode he was admitted for recently. I inquired whether he was still getting Paxil and she stated that he was. Upon review of the recent discharge summary, he was supposed to discontinue this medicine. I made it very clear to the med tech that this was likely the culprit of his myoclonic jerks and that this medication should be discontinued. I also informed the nurse who was taking care of him here in the ER to make note of this when she called report back to the facility. I also made it very clear on his discharge paperwork. I believe his symptoms are likely due to the fact that he is still taking Paxil. As he has been well appearing in the ER without any evidence of these myoclonic jerks, I think it is reasonable to discharge him back to his facility. I  interpreted/reviewed the labs and/or imaging which were non-contributory.  Blood in UA likely related to attempted I/O cath attempts. Ultimately had to use a condom catheter to collect urine.      Purvis Sheffield, MD 09/26/13 [phone]

## 2013-09-25 NOTE — ED Notes (Signed)
Pt provided apple sauce and ginger ale. Tolerating well.

## 2013-09-25 NOTE — ED Notes (Signed)
Per EMS- Pt comes from [facility]; has advanced dementia, last night was LSN at 9 pm. This morning tech got him up for breakfast and reported he was weak. At breakfast, had tremors and speech was incoherent. CBG 86, BP 100/70, 96% RA. EKG RBBB and occasional PVCs. Pt able to state name and follow basic commands, reports he feels generally weak.

## 2013-09-25 NOTE — ED Notes (Signed)
Patient transported to X-ray 

## 2013-09-25 NOTE — Discharge Instructions (Signed)
Please discontinue the patient's Paxil prescription. He is having a reaction to this medication which is causing jerking movements.

## 2013-09-25 NOTE — ED Notes (Signed)
EMT and RN attempt In out Out Cath. Unable to pass catheter. During first attempt, dark red blood visible. Procedure stopped. MD made aware. Second attempt made by RN, unable to pass catheter. MD made aware. Reports to apply condom cath.

## 2013-10-17 ENCOUNTER — Emergency Department (HOSPITAL_COMMUNITY)
Admission: EM | Admit: 2013-10-17 | Discharge: 2013-10-17 | Disposition: A | Discharge status: Home / Self Care | Payer: Medicare Other | Attending: Emergency Medicine | Admitting: Emergency Medicine

## 2013-10-17 ENCOUNTER — Encounter (HOSPITAL_COMMUNITY): Payer: Self-pay | Admitting: Emergency Medicine

## 2013-10-17 ENCOUNTER — Emergency Department (HOSPITAL_COMMUNITY): Payer: Medicare Other

## 2013-10-17 DIAGNOSIS — E785 Hyperlipidemia, unspecified: Secondary | ICD-10-CM | POA: Insufficient documentation

## 2013-10-17 DIAGNOSIS — Z7982 Long term (current) use of aspirin: Secondary | ICD-10-CM | POA: Diagnosis not present

## 2013-10-17 DIAGNOSIS — Z79899 Other long term (current) drug therapy: Secondary | ICD-10-CM | POA: Diagnosis not present

## 2013-10-17 DIAGNOSIS — G309 Alzheimer's disease, unspecified: Secondary | ICD-10-CM | POA: Diagnosis not present

## 2013-10-17 DIAGNOSIS — K219 Gastro-esophageal reflux disease without esophagitis: Secondary | ICD-10-CM | POA: Diagnosis not present

## 2013-10-17 DIAGNOSIS — Z791 Long term (current) use of non-steroidal anti-inflammatories (NSAID): Secondary | ICD-10-CM | POA: Insufficient documentation

## 2013-10-17 DIAGNOSIS — I10 Essential (primary) hypertension: Secondary | ICD-10-CM | POA: Diagnosis not present

## 2013-10-17 DIAGNOSIS — K5900 Constipation, unspecified: Secondary | ICD-10-CM | POA: Diagnosis present

## 2013-10-17 DIAGNOSIS — R011 Cardiac murmur, unspecified: Secondary | ICD-10-CM | POA: Insufficient documentation

## 2013-10-17 DIAGNOSIS — F0280 Dementia in other diseases classified elsewhere without behavioral disturbance: Secondary | ICD-10-CM | POA: Insufficient documentation

## 2013-10-17 LAB — COMPREHENSIVE METABOLIC PANEL
ALT: 20 U/L (ref 0–53)
ANION GAP: 16 — AB (ref 5–15)
AST: 26 U/L (ref 0–37)
Albumin: 3.4 g/dL — ABNORMAL LOW (ref 3.5–5.2)
Alkaline Phosphatase: 128 U/L — ABNORMAL HIGH (ref 39–117)
BILIRUBIN TOTAL: 0.7 mg/dL (ref 0.3–1.2)
BUN: 17 mg/dL (ref 6–23)
CHLORIDE: 99 meq/L (ref 96–112)
CO2: 23 meq/L (ref 19–32)
CREATININE: 1.33 mg/dL (ref 0.50–1.35)
Calcium: 9.3 mg/dL (ref 8.4–10.5)
GFR calc Af Amer: 55 mL/min — ABNORMAL LOW (ref >90)
GFR, EST NON AFRICAN AMERICAN: 47 mL/min — AB (ref >90)
Glucose, Bld: 79 mg/dL (ref 70–99)
Potassium: 4.4 mEq/L (ref 3.7–5.3)
Sodium: 138 mEq/L (ref 137–147)
Total Protein: 8.2 g/dL (ref 6.0–8.3)

## 2013-10-17 LAB — URINALYSIS, ROUTINE W REFLEX MICROSCOPIC
Bilirubin Urine: NEGATIVE
Glucose, UA: NEGATIVE mg/dL
Hgb urine dipstick: NEGATIVE
KETONES UR: NEGATIVE mg/dL
LEUKOCYTES UA: NEGATIVE
Nitrite: NEGATIVE
PROTEIN: NEGATIVE mg/dL
Specific Gravity, Urine: 1.014 (ref 1.005–1.030)
UROBILINOGEN UA: 2 mg/dL — AB (ref 0.0–1.0)
pH: 8.5 — ABNORMAL HIGH (ref 5.0–8.0)

## 2013-10-17 LAB — CBC WITH DIFFERENTIAL/PLATELET
BASOS PCT: 1 % (ref 0–1)
Basophils Absolute: 0 10*3/uL (ref 0.0–0.1)
EOS PCT: 1 % (ref 0–5)
Eosinophils Absolute: 0 10*3/uL (ref 0.0–0.7)
HEMATOCRIT: 37.1 % — AB (ref 39.0–52.0)
HEMOGLOBIN: 12.4 g/dL — AB (ref 13.0–17.0)
LYMPHS PCT: 32 % (ref 12–46)
Lymphs Abs: 1.3 10*3/uL (ref 0.7–4.0)
MCH: 29.9 pg (ref 26.0–34.0)
MCHC: 33.4 g/dL (ref 30.0–36.0)
MCV: 89.4 fL (ref 78.0–100.0)
MONO ABS: 0.4 10*3/uL (ref 0.1–1.0)
Monocytes Relative: 9 % (ref 3–12)
Neutro Abs: 2.4 10*3/uL (ref 1.7–7.7)
Neutrophils Relative %: 57 % (ref 43–77)
Platelets: 287 10*3/uL (ref 150–400)
RBC: 4.15 MIL/uL — ABNORMAL LOW (ref 4.22–5.81)
RDW: 15 % (ref 11.5–15.5)
WBC: 4.1 10*3/uL (ref 4.0–10.5)

## 2013-10-17 MED ORDER — ONDANSETRON 4 MG PO TBDP: 4.0000 mg | ORAL_TABLET | Freq: Three times a day (TID) | ORAL | Status: AC | PRN

## 2013-10-17 MED ORDER — ONDANSETRON HCL 4 MG/2ML IJ SOLN
4.0000 mg | Freq: Once | INTRAMUSCULAR | Status: AC
  Administered 2013-10-17: 4 mg via INTRAVENOUS
  Filled 2013-10-17: qty 2

## 2013-10-17 MED ORDER — POLYETHYLENE GLYCOL 3350 17 GM/SCOOP PO POWD: 17.0000 g | Freq: Every day | ORAL | Status: AC

## 2013-10-17 NOTE — ED Notes (Signed)
Patient is from [facility]

## 2013-10-17 NOTE — ED Notes (Signed)
Patient was given a glass of water and a sandwich. Patient is eating and drinking without any difficulty.

## 2013-10-17 NOTE — Discharge Instructions (Signed)
Please follow directions provided.  Be sure to follow-up with his primary care provider regarding his symptoms.  He may take Zofran for nausea to assist with passage of stool.   SEEK IMMEDIATE MEDICAL CARE IF:  You have bright red blood in your stool.  Your constipation lasts for more than 4 days or gets worse.  You have abdominal or rectal pain.  You have thin, pencil-like stools.  You have unexplained weight loss.

## 2013-10-17 NOTE — ED Provider Notes (Signed)
CSN: [id number]     Arrival date & time 10/17/13  1824 History   First MD Initiated Contact with Patient 10/17/13 1825     Chief Complaint  Patient presents with  . Constipation   (Consider location/radiation/quality/duration/timing/severity/associated sxs/prior Treatment)  HPI Ronald Perkins is a 80 yo male presenting from a nursing facility via EMS with report of agitation today appr 2 hours PTA.  The nursing facility states he was ate dinner when he became agitated and the staff gave him ativan.  They state shortly after, he vomited and when asked if anything was wrong reported abdominal pain.  They report that he has not had a bm in 2 days, so he was sent her for evaluation.  They deny any fever, chills, altered mental status or vomiting prior to today.  Past Medical History  Diagnosis Date  . Dementia   . Hypertension   . GERD (gastroesophageal reflux disease)   . Hyperlipidemia   . Alzheimer disease    History reviewed. No pertinent past surgical history. No family history on file. History  Substance Use Topics  . Smoking status: Never Smoker   . Smokeless tobacco: Never Used  . Alcohol Use: No    Review of Systems  Unable to perform ROS: Dementia    Allergies  Review of patient's allergies indicates no known allergies.  Home Medications   Prior to Admission medications   Medication Sig Start Date End Date Taking? Authorizing Provider  acetaminophen (TYLENOL) 500 MG tablet Take 500 mg by mouth every 4 (four) hours as needed for pain.    Historical Provider, MD  aluminum-magnesium hydroxide-simethicone (MAALOX) 200-200-20 MG/5ML SUSP Take 30 mLs by mouth every 6 (six) hours as needed (indegestion).    Historical Provider, MD  aspirin 81 MG chewable tablet Chew 81 mg by mouth at bedtime.    Historical Provider, MD  diclofenac sodium (VOLTAREN) 1 % GEL Apply 4 g topically 4 (four) times daily as needed (knee pain).    Historical Provider, MD  donepezil (ARICEPT) 10 MG  tablet Take 10 mg by mouth at bedtime.    Historical Provider, MD  guaiFENesin (ROBITUSSIN) 100 MG/5ML liquid Take 10 mLs by mouth every 6 (six) hours as needed for cough.     Historical Provider, MD  loperamide (IMODIUM) 2 MG capsule Take 2 mg by mouth 4 (four) times daily as needed for diarrhea or loose stools.    Historical Provider, MD  magnesium hydroxide (MILK OF MAGNESIA) 400 MG/5ML suspension Take 30 mLs by mouth at bedtime as needed for constipation.    Historical Provider, MD  Neomycin-Bacitracin-Polymyxin (TRIPLE ANTIBIOTIC EX) Apply 1 application topically daily as needed (skin tears, minor irritations).    Historical Provider, MD  omeprazole (PRILOSEC) 20 MG capsule Take 20 mg by mouth every evening.    Historical Provider, MD  ondansetron (ZOFRAN) 4 MG tablet Take 4 mg by mouth every 8 (eight) hours as needed for nausea.    Historical Provider, MD  PARoxetine (PAXIL) 20 MG tablet Take 20 mg by mouth daily.    Historical Provider, MD  PRESCRIPTION MEDICATION Apply 0.5 mg topically every 6 (six) hours as needed (for agitation). Lorazepam 0.5mg /ml gel     Apply to inner wrist.    Historical Provider, MD  QUEtiapine (SEROQUEL) 50 MG tablet Take 50 mg by mouth 2 (two) times daily.    Historical Provider, MD  ramipril (ALTACE) 10 MG capsule Take 10 mg by mouth daily before breakfast.  Historical Provider, MD  risperiDONE (RISPERDAL) 1 MG/ML oral solution Take 0.5 mg by mouth at bedtime.    Historical Provider, MD  Vitamin D, Ergocalciferol, (DRISDOL) 50000 UNITS CAPS capsule Take 50,000 Units by mouth every 30 (thirty) days.    Historical Provider, MD   BP 107/80  Pulse 82  Temp(Src) 97.3 F (36.3 C) (Oral)  Resp 22  SpO2 100% Physical Exam  Nursing note and vitals reviewed. Constitutional: He appears well-developed and well-nourished. No distress.  HENT:  Head: Normocephalic and atraumatic.  Mouth/Throat: Oropharynx is clear and moist. No oropharyngeal exudate.  Eyes:  Conjunctivae are normal.  Neck: Neck supple. No thyromegaly present.  Cardiovascular: Normal rate, regular rhythm and intact distal pulses.   Murmur heard.  Systolic murmur is present with a grade of 3/6  Pulmonary/Chest: Effort normal and breath sounds normal. No respiratory distress. He has no decreased breath sounds. He has no wheezes. He has no rhonchi. He has no rales. He exhibits no tenderness.  Abdominal: Soft. Normal appearance and bowel sounds are normal. He exhibits no distension and no mass. There is no tenderness. There is no rigidity, no rebound, no guarding, no CVA tenderness, no tenderness at McBurney's point and negative Murphy's sign.  Musculoskeletal: He exhibits no tenderness.  Lymphadenopathy:    He has no cervical adenopathy.  Neurological: He is alert.  Skin: Skin is warm and dry. No rash noted. He is not diaphoretic.  Psychiatric: He has a normal mood and affect.    ED Course  Procedures (including critical care time) Labs Review Labs Reviewed  CBC WITH DIFFERENTIAL - Abnormal; Notable for the following:    RBC 4.15 (*)    Hemoglobin 12.4 (*)    HCT 37.1 (*)    All other components within normal limits  COMPREHENSIVE METABOLIC PANEL - Abnormal; Notable for the following:    Albumin 3.4 (*)    Alkaline Phosphatase 128 (*)    GFR calc non Af Amer 47 (*)    GFR calc Af Amer 55 (*)    Anion gap 16 (*)    All other components within normal limits  URINALYSIS, ROUTINE W REFLEX MICROSCOPIC - Abnormal; Notable for the following:    pH 8.5 (*)    Urobilinogen, UA 2.0 (*)    All other components within normal limits  POC OCCULT BLOOD, ED    Imaging Review DG Abd Acute W/Chest (Final result)  Result time: 10/17/13 20:01:23    Final result by Rad Results In Interface (10/17/13 20:01:23)    Narrative:   CLINICAL DATA: Constipation  EXAM: ACUTE ABDOMEN SERIES (ABDOMEN 2 VIEW & CHEST 1 VIEW)  COMPARISON: Chest radiographs dated 09/25/2013  FINDINGS: Chronic  interstitial markings. No focal consolidation. No pleural effusion or pneumothorax.  The heart is mildly enlarged. Postsurgical changes related to prior CABG.  Old left rib fracture deformities.  Nonspecific but nonobstructive bowel gas pattern. Mild rectal stool burden.  No evidence of free air on the lateral decubitus view.  IMPRESSION: No evidence of acute cardiopulmonary disease.  No evidence of small bowel obstruction or free air.  Mild rectal stool burden.      EKG Interpretation None      MDM   Final diagnoses:  Constipation, unspecified constipation type   80 yo male from [facility] with report of some agitation at dinner and no bm x 2 days. CBC, CMP, UA, without significant abnormality. Acute abdominal series: mild stool burden. Pt reports feeling hungry, able to eat and  drink without difficulty. Pt is in no acute distress and does not appear toxic or septic.  On repeat exam, pt continues to have bowel sounds and no signs of peritonitis or a surgical abdomen.  No indication of appendicitis, bowel obstruction, bowel perforation, cholecystitis, diverticulitis.  Patient appears safe to be discharged back to the facility.  Discharge instructions include prescription for Miralax and zofran, symptomatic treatment and follow-up with his primary care physician.  Return precautions provided with discharge instructions.    Filed Vitals:   10/17/13 1840 10/17/13 2039  BP: 107/80 123/90  Pulse: 82 86  Temp: 97.3 F (36.3 C)   TempSrc: Oral   Resp: 22 20  SpO2: 100% 100%   Meds given in ED:  Medications  ondansetron (ZOFRAN) injection 4 mg (4 mg Intravenous Given 10/17/13 2008)    Discharge Medication List as of 10/17/2013  9:17 PM        ED Discharge Orders Hide    Start     Ordered   10/17/13 0000  ondansetron (ZOFRAN ODT) 4 MG disintegrating tablet Every 8 hours PRN Discontinue Reprint 10/17/13 2117   10/17/13 0000  polyethylene glycol powder  (GLYCOLAX/MIRALAX) powder Daily Discontinue Reprint 10/17/13 2117            Harle Battiest, NP 10/20/13 1419

## 2013-10-17 NOTE — ED Notes (Signed)
Called report to [facility] [phone]

## 2013-10-17 NOTE — ED Notes (Signed)
Bed: WA09 Expected date: 10/17/13 Expected time: 6:19 PM Means of arrival: Ambulance Comments: constipation

## 2013-10-17 NOTE — ED Notes (Signed)
Per EMS: Pt hasn't had a BM in 2 days, c/o bilateral upper abdominal pain. Emesis x 1 earlier today, none since EMS arrival. Pt not complaining of nausea at this time. Hx of dementia.

## 2013-10-17 NOTE — ED Provider Notes (Signed)
Level V caveat dementia. Patient was reportedly no abdominal pain for 2 days. Patient states he is presently hungry. On exam no distress. Lungs clear auscultation heart regular rate and rhythm abdomen nondistended normal active bowel sounds nontender genitalia normal male  Doug Sou, MD 10/17/13 2308

## 2013-10-18 LAB — POC OCCULT BLOOD, ED: FECAL OCCULT BLD: NEGATIVE

## 2013-10-21 NOTE — ED Provider Notes (Signed)
Medical screening examination/treatment/procedure(s) were conducted as a shared visit with non-physician practitioner(s) and myself.  I personally evaluated the patient during the encounter.   EKG Interpretation None       Doug SouSam Murdis Flitton, MD 10/21/13 [phone]

## 2013-11-21 ENCOUNTER — Encounter (HOSPITAL_COMMUNITY): Payer: Self-pay | Admitting: Emergency Medicine

## 2013-11-21 ENCOUNTER — Emergency Department (HOSPITAL_COMMUNITY)
Admission: EM | Admit: 2013-11-21 | Discharge: 2013-11-21 | Disposition: A | Discharge status: Home / Self Care | Payer: Medicare Other | Attending: Emergency Medicine | Admitting: Emergency Medicine

## 2013-11-21 DIAGNOSIS — K219 Gastro-esophageal reflux disease without esophagitis: Secondary | ICD-10-CM | POA: Insufficient documentation

## 2013-11-21 DIAGNOSIS — Z7982 Long term (current) use of aspirin: Secondary | ICD-10-CM | POA: Insufficient documentation

## 2013-11-21 DIAGNOSIS — Z791 Long term (current) use of non-steroidal anti-inflammatories (NSAID): Secondary | ICD-10-CM | POA: Insufficient documentation

## 2013-11-21 DIAGNOSIS — Z79899 Other long term (current) drug therapy: Secondary | ICD-10-CM | POA: Diagnosis not present

## 2013-11-21 DIAGNOSIS — F0391 Unspecified dementia with behavioral disturbance: Secondary | ICD-10-CM | POA: Diagnosis not present

## 2013-11-21 DIAGNOSIS — R112 Nausea with vomiting, unspecified: Secondary | ICD-10-CM | POA: Diagnosis not present

## 2013-11-21 DIAGNOSIS — I10 Essential (primary) hypertension: Secondary | ICD-10-CM | POA: Insufficient documentation

## 2013-11-21 DIAGNOSIS — Z8639 Personal history of other endocrine, nutritional and metabolic disease: Secondary | ICD-10-CM | POA: Diagnosis not present

## 2013-11-21 LAB — COMPREHENSIVE METABOLIC PANEL
ALT: 10 U/L (ref 0–53)
AST: 19 U/L (ref 0–37)
Albumin: 3.4 g/dL — ABNORMAL LOW (ref 3.5–5.2)
Alkaline Phosphatase: 124 U/L — ABNORMAL HIGH (ref 39–117)
Anion gap: 15 (ref 5–15)
BILIRUBIN TOTAL: 0.4 mg/dL (ref 0.3–1.2)
BUN: 22 mg/dL (ref 6–23)
CHLORIDE: 104 meq/L (ref 96–112)
CO2: 22 meq/L (ref 19–32)
CREATININE: 1.36 mg/dL — AB (ref 0.50–1.35)
Calcium: 9 mg/dL (ref 8.4–10.5)
GFR, EST AFRICAN AMERICAN: 53 mL/min — AB (ref >90)
GFR, EST NON AFRICAN AMERICAN: 46 mL/min — AB (ref >90)
GLUCOSE: 115 mg/dL — AB (ref 70–99)
Potassium: 4.2 mEq/L (ref 3.7–5.3)
Sodium: 141 mEq/L (ref 137–147)
Total Protein: 8 g/dL (ref 6.0–8.3)

## 2013-11-21 LAB — CBC
HEMATOCRIT: 35.6 % — AB (ref 39.0–52.0)
HEMOGLOBIN: 11.4 g/dL — AB (ref 13.0–17.0)
MCH: 29.8 pg (ref 26.0–34.0)
MCHC: 32 g/dL (ref 30.0–36.0)
MCV: 93.2 fL (ref 78.0–100.0)
Platelets: 275 10*3/uL (ref 150–400)
RBC: 3.82 MIL/uL — AB (ref 4.22–5.81)
RDW: 15.8 % — ABNORMAL HIGH (ref 11.5–15.5)
WBC: 5.5 10*3/uL (ref 4.0–10.5)

## 2013-11-21 LAB — URINALYSIS, ROUTINE W REFLEX MICROSCOPIC
Bilirubin Urine: NEGATIVE
Glucose, UA: NEGATIVE mg/dL
HGB URINE DIPSTICK: NEGATIVE
KETONES UR: NEGATIVE mg/dL
Nitrite: NEGATIVE
PROTEIN: NEGATIVE mg/dL
Specific Gravity, Urine: 1.017 (ref 1.005–1.030)
UROBILINOGEN UA: 0.2 mg/dL (ref 0.0–1.0)
pH: 6 (ref 5.0–8.0)

## 2013-11-21 LAB — URINE MICROSCOPIC-ADD ON

## 2013-11-21 MED ORDER — LORAZEPAM 1 MG PO TABS: 1.0000 mg | ORAL_TABLET | Freq: Once | ORAL | Status: DC

## 2013-11-21 MED ORDER — ONDANSETRON 4 MG PO TBDP: 4.0000 mg | ORAL_TABLET | Freq: Once | ORAL | Status: DC

## 2013-11-21 MED ORDER — LORAZEPAM 2 MG/ML IJ SOLN
INTRAMUSCULAR | Status: AC
  Administered 2013-11-21: 1 mg
  Filled 2013-11-21: qty 1

## 2013-11-21 MED ORDER — ONDANSETRON HCL 4 MG/2ML IJ SOLN
4.0000 mg | Freq: Once | INTRAMUSCULAR | Status: AC
  Administered 2013-11-21: 4 mg via INTRAVENOUS
  Filled 2013-11-21: qty 2

## 2013-11-21 NOTE — ED Provider Notes (Signed)
CSN: [id number]     Arrival date & time 11/21/13  1213 History   First MD Initiated Contact with Patient 11/21/13 1230     Chief Complaint  Patient presents with  . Emesis  . Nausea     (Consider location/radiation/quality/duration/timing/severity/associated sxs/prior Treatment) HPI   A LEVEL 5 CAVEAT PERTAINS DUE TO DEMENTIA Pt presents from nursing facility due to concerns of nausea and vomiting.  Pt has been spitting into a bag per EMS, they did not witness vomiting.  Pt denies abdominal pain.  States he hurts all over.    Past Medical History  Diagnosis Date  . Dementia   . Hypertension   . GERD (gastroesophageal reflux disease)   . Hyperlipidemia   . Alzheimer disease    History reviewed. No pertinent past surgical history. No family history on file. History  Substance Use Topics  . Smoking status: Never Smoker   . Smokeless tobacco: Never Used  . Alcohol Use: No    Review of Systems  UNABLE TO OBTAIN ROS DUE TO LEVEL 5 CAVEAT    Allergies  Review of patient's allergies indicates no known allergies.  Home Medications   Prior to Admission medications   Medication Sig Start Date End Date Taking? Authorizing Provider  acetaminophen (TYLENOL) 500 MG tablet Take 500 mg by mouth every 4 (four) hours as needed (pain). Max dose of acetaminophen is 3000 mg/24 hrs from all sources   Yes Historical Provider, MD  aluminum-magnesium hydroxide-simethicone (MAALOX) 200-200-20 MG/5ML SUSP Take 30 mLs by mouth every 6 (six) hours as needed (heartburn/indigestion). Not to exceed 4 doses in 24 hours   Yes Historical Provider, MD  aspirin 81 MG chewable tablet Chew 81 mg by mouth at bedtime.   Yes Historical Provider, MD  diclofenac sodium (VOLTAREN) 1 % GEL Apply 4 g topically 4 (four) times daily as needed (knee pain).   Yes Historical Provider, MD  donepezil (ARICEPT) 10 MG tablet Take 10 mg by mouth at bedtime.   Yes Historical Provider, MD  guaiFENesin (ROBITUSSIN) 100 MG/5ML  liquid Take 10 mLs by mouth every 6 (six) hours as needed for cough. Not to exceed 4 doses in 24 hours   Yes Historical Provider, MD  loperamide (IMODIUM) 2 MG capsule Take 2 mg by mouth as needed for diarrhea or loose stools. Not to exceed 8 doses in 24 hours   Yes Historical Provider, MD  magnesium hydroxide (MILK OF MAGNESIA) 400 MG/5ML suspension Take 30 mLs by mouth at bedtime as needed (constipation).    Yes Historical Provider, MD  Neomycin-Bacitracin-Polymyxin (TRIPLE ANTIBIOTIC EX) Apply 1 application topically daily as needed (skin tears, minor irritations). Apply after clean with normal saline, voer with bandaid or gauze, secure with tape - change as needed until healed for skin tears, abrasions or minor irritations   Yes Historical Provider, MD  omeprazole (PRILOSEC) 20 MG capsule Take 20 mg by mouth every evening.   Yes Historical Provider, MD  ondansetron (ZOFRAN) 4 MG tablet Take 4 mg by mouth every 8 (eight) hours as needed for nausea or vomiting.    Yes Historical Provider, MD  polyethylene glycol powder (GLYCOLAX/MIRALAX) powder Take 17 g by mouth daily. Until daily soft stools  OTC 10/17/13  Yes Harle BattiestElizabeth Tysinger, NP  PRESCRIPTION MEDICATION Apply 1 mg topically every 6 (six) hours as needed (anxiety/agitation). Lorazepam 1mg /gm gel (gloves when handling   Yes Historical Provider, MD  QUEtiapine (SEROQUEL) 25 MG tablet Take 25 mg by mouth at bedtime. Take with  50 mg tablet for a 75 mg dose   Yes Historical Provider, MD  QUEtiapine (SEROQUEL) 50 MG tablet Take 50 mg by mouth 2 (two) times daily. Also take 25 mg tablet at night for a 75 mg dose (50 mg every morning)   Yes Historical Provider, MD  ramipril (ALTACE) 10 MG capsule Take 10 mg by mouth daily.    Yes Historical Provider, MD  risperiDONE (RISPERDAL) 1 MG/ML oral solution Take 0.5 mg by mouth 2 (two) times daily.   Yes Historical Provider, MD  senna (SENOKOT) 8.6 MG TABS tablet Take 1 tablet by mouth at bedtime.   Yes  Historical Provider, MD  Vitamin D, Ergocalciferol, (DRISDOL) 50000 UNITS CAPS capsule Take 50,000 Units by mouth every 30 (thirty) days. On the 24th of each month   Yes Historical Provider, MD  ondansetron (ZOFRAN ODT) 4 MG disintegrating tablet Take 1 tablet (4 mg total) by mouth every 8 (eight) hours as needed for nausea or vomiting. 10/17/13   Harle BattiestElizabeth Tysinger, NP  PARoxetine (PAXIL) 20 MG tablet Take 20 mg by mouth daily.    Historical Provider, MD  QUEtiapine (SEROQUEL) 50 MG tablet Take 50-75 mg by mouth 3 (three) times daily. 75 mg qhs and 50 mg bid    Historical Provider, MD   BP 130/81 mmHg  Pulse 79  Temp(Src) 98.3 F (36.8 C) (Temporal)  Resp 16  SpO2 93%  Vitals reviewed Physical Exam  Physical Examination: General appearance - alert, well appearing, and in no distress, pt spitting into an emesis bag, no vomiting Mental status - alert, oriented to person, not to place or time- this is his baseline Eyes - no scleral icterus, no conjunctival injection Mouth - mucous membranes moist, pharynx normal without lesions Chest - clear to auscultation, no wheezes, rales or rhonchi, symmetric air entry Heart - normal rate, regular rhythm, normal S1, S2, no murmurs, rubs, clicks or gallops Abdomen - soft, nontender, nondistended, no masses or organomegaly Neurological - alert, oriented x 1, no cranial nerve defect, moving all extremities, strength and sensation intact, agitated, intermittently cooperative and then shouting at staff, at times redirectable Extremities - peripheral pulses normal, no pedal edema, no clubbing or cyanosis Skin - normal coloration and turgor, no rashes  ED Course  Procedures (including critical care time) Labs Review Labs Reviewed  CBC - Abnormal; Notable for the following:    RBC 3.82 (*)    Hemoglobin 11.4 (*)    HCT 35.6 (*)    RDW 15.8 (*)    All other components within normal limits  COMPREHENSIVE METABOLIC PANEL - Abnormal; Notable for the  following:    Glucose, Bld 115 (*)    Creatinine, Ser 1.36 (*)    Albumin 3.4 (*)    Alkaline Phosphatase 124 (*)    GFR calc non Af Amer 46 (*)    GFR calc Af Amer 53 (*)    All other components within normal limits  URINALYSIS, ROUTINE W REFLEX MICROSCOPIC - Abnormal; Notable for the following:    APPearance TURBID (*)    Leukocytes, UA SMALL (*)    All other components within normal limits  URINE MICROSCOPIC-ADD ON    Imaging Review No results found.   EKG Interpretation   Date/Time:  Sunday November 21 2013 13:27:05 EST Ventricular Rate:  91 PR Interval:  262 QRS Duration: 146 QT Interval:  397 QTC Calculation: 488 R Axis:   25 Text Interpretation:  Sinus or ectopic atrial rhythm Prolonged PR interval  Right bundle branch block Inferior infarct, age indeterminate No  significant change since last tracing Confirmed by [facility]  MD, Glynna Failla  [phone]) on 11/21/2013 2:20:32 PM      MDM   Final diagnoses:  Dementia, with behavioral disturbance  Non-intractable vomiting with nausea, vomiting of unspecified type    Pt presenting with concern for nausea and vomiting.  Pt has dementia and is at his baseline.  Abdominal exam is benign.  Pt spitting in emesis bag, no active vomiting.  He was combative and agitated, given a dose of ativan and zofran.  Pt has reassuring labs,  Is tolerating po fluids in the ED.  Discharged with strict return precautions.  Pt agreeable with plan.    Ethelda Chick, MD 11/21/13 [phone]

## 2013-11-21 NOTE — ED Notes (Signed)
[facility] contacted for transport 

## 2013-11-21 NOTE — ED Notes (Signed)
Per [facility], pt from [facility] care unit. C/o intermittent SOB and patient c/o "choking". Pt speaking in full sentencews, airway patent. Respirations equal and unlabored. Nausea and vomiting x6 hours. Pt spitting into emesis bag, no witnessed emesis by EMS. RBB and afib on the montior by EMS

## 2013-11-21 NOTE — ED Notes (Signed)
Patient combative, attempting to punch staff and spitting.  Per [facility] this is patient's neuro baseline.

## 2013-11-21 NOTE — Discharge Instructions (Signed)
Return to the ED with any concerns including vomiting and not able to keep down liquids, blood or bilious vomit, abdominal pain, fainting, decreased level of alertness/lethargy, or any other alarming symptoms

## 2013-11-21 NOTE — ED Notes (Signed)
Patient drinking glass of water without issue.

## 2013-11-24 ENCOUNTER — Emergency Department (HOSPITAL_COMMUNITY)
Admission: EM | Admit: 2013-11-24 | Discharge: 2013-11-24 | Disposition: A | Discharge status: Home / Self Care | Payer: Medicare Other | Attending: Emergency Medicine | Admitting: Emergency Medicine

## 2013-11-24 ENCOUNTER — Emergency Department (HOSPITAL_COMMUNITY): Payer: Medicare Other

## 2013-11-24 ENCOUNTER — Encounter (HOSPITAL_COMMUNITY): Payer: Self-pay | Admitting: Emergency Medicine

## 2013-11-24 DIAGNOSIS — Z791 Long term (current) use of non-steroidal anti-inflammatories (NSAID): Secondary | ICD-10-CM | POA: Diagnosis not present

## 2013-11-24 DIAGNOSIS — Y9389 Activity, other specified: Secondary | ICD-10-CM | POA: Diagnosis not present

## 2013-11-24 DIAGNOSIS — W19XXXA Unspecified fall, initial encounter: Secondary | ICD-10-CM

## 2013-11-24 DIAGNOSIS — Y9289 Other specified places as the place of occurrence of the external cause: Secondary | ICD-10-CM | POA: Diagnosis not present

## 2013-11-24 DIAGNOSIS — Z043 Encounter for examination and observation following other accident: Secondary | ICD-10-CM | POA: Diagnosis not present

## 2013-11-24 DIAGNOSIS — G309 Alzheimer's disease, unspecified: Secondary | ICD-10-CM | POA: Insufficient documentation

## 2013-11-24 DIAGNOSIS — I10 Essential (primary) hypertension: Secondary | ICD-10-CM | POA: Diagnosis not present

## 2013-11-24 DIAGNOSIS — K219 Gastro-esophageal reflux disease without esophagitis: Secondary | ICD-10-CM | POA: Diagnosis not present

## 2013-11-24 DIAGNOSIS — Z8659 Personal history of other mental and behavioral disorders: Secondary | ICD-10-CM | POA: Diagnosis not present

## 2013-11-24 DIAGNOSIS — Z792 Long term (current) use of antibiotics: Secondary | ICD-10-CM | POA: Diagnosis not present

## 2013-11-24 DIAGNOSIS — W01198A Fall on same level from slipping, tripping and stumbling with subsequent striking against other object, initial encounter: Secondary | ICD-10-CM | POA: Insufficient documentation

## 2013-11-24 DIAGNOSIS — Z7982 Long term (current) use of aspirin: Secondary | ICD-10-CM | POA: Insufficient documentation

## 2013-11-24 DIAGNOSIS — Z8639 Personal history of other endocrine, nutritional and metabolic disease: Secondary | ICD-10-CM | POA: Insufficient documentation

## 2013-11-24 DIAGNOSIS — Z79899 Other long term (current) drug therapy: Secondary | ICD-10-CM | POA: Diagnosis not present

## 2013-11-24 NOTE — ED Provider Notes (Signed)
CSN: [id number]     Arrival date & time 11/24/13  1542 History   First MD Initiated Contact with Patient 11/24/13 1555     Chief Complaint  Patient presents with  . Fall     (Consider location/radiation/quality/duration/timing/severity/associated sxs/prior Treatment) HPI Comments: Level V caveat due to dementia  Patient is a 80 y.o. male presenting with fall. The history is provided by the patient.  Fall This is a new problem. The current episode started less than 1 hour ago. Episode frequency: once. The problem has not changed since onset.Pertinent negatives include no abdominal pain and no shortness of breath. Nothing aggravates the symptoms. Nothing relieves the symptoms.    Past Medical History  Diagnosis Date  . Dementia   . Hypertension   . GERD (gastroesophageal reflux disease)   . Hyperlipidemia   . Alzheimer disease    History reviewed. No pertinent past surgical history. No family history on file. History  Substance Use Topics  . Smoking status: Never Smoker   . Smokeless tobacco: Never Used  . Alcohol Use: No    Review of Systems  Constitutional: Negative for fever.  Respiratory: Negative for cough and shortness of breath.   Gastrointestinal: Negative for nausea, vomiting and abdominal pain.  All other systems reviewed and are negative.     Allergies  Review of patient's allergies indicates no known allergies.  Home Medications   Prior to Admission medications   Medication Sig Start Date End Date Taking? Authorizing Provider  acetaminophen (TYLENOL) 500 MG tablet Take 500 mg by mouth every 4 (four) hours as needed (pain). Max dose of acetaminophen is 3000 mg/24 hrs from all sources    Historical Provider, MD  aluminum-magnesium hydroxide-simethicone (MAALOX) 200-200-20 MG/5ML SUSP Take 30 mLs by mouth every 6 (six) hours as needed (heartburn/indigestion). Not to exceed 4 doses in 24 hours    Historical Provider, MD  aspirin 81 MG chewable tablet Chew 81  mg by mouth at bedtime.    Historical Provider, MD  diclofenac sodium (VOLTAREN) 1 % GEL Apply 4 g topically 4 (four) times daily as needed (knee pain).    Historical Provider, MD  donepezil (ARICEPT) 10 MG tablet Take 10 mg by mouth at bedtime.    Historical Provider, MD  guaiFENesin (ROBITUSSIN) 100 MG/5ML liquid Take 10 mLs by mouth every 6 (six) hours as needed for cough. Not to exceed 4 doses in 24 hours    Historical Provider, MD  loperamide (IMODIUM) 2 MG capsule Take 2 mg by mouth as needed for diarrhea or loose stools. Not to exceed 8 doses in 24 hours    Historical Provider, MD  magnesium hydroxide (MILK OF MAGNESIA) 400 MG/5ML suspension Take 30 mLs by mouth at bedtime as needed (constipation).     Historical Provider, MD  Neomycin-Bacitracin-Polymyxin (TRIPLE ANTIBIOTIC EX) Apply 1 application topically daily as needed (skin tears, minor irritations). Apply after clean with normal saline, voer with bandaid or gauze, secure with tape - change as needed until healed for skin tears, abrasions or minor irritations    Historical Provider, MD  omeprazole (PRILOSEC) 20 MG capsule Take 20 mg by mouth every evening.    Historical Provider, MD  ondansetron (ZOFRAN ODT) 4 MG disintegrating tablet Take 1 tablet (4 mg total) by mouth every 8 (eight) hours as needed for nausea or vomiting. 10/17/13   Harle Battiest, NP  ondansetron (ZOFRAN) 4 MG tablet Take 4 mg by mouth every 8 (eight) hours as needed for nausea or vomiting.  Historical Provider, MD  PARoxetine (PAXIL) 20 MG tablet Take 20 mg by mouth daily.    Historical Provider, MD  polyethylene glycol powder (GLYCOLAX/MIRALAX) powder Take 17 g by mouth daily. Until daily soft stools  OTC 10/17/13   Harle BattiestElizabeth Tysinger, NP  PRESCRIPTION MEDICATION Apply 1 mg topically every 6 (six) hours as needed (anxiety/agitation). Lorazepam 1mg /gm gel (gloves when handling    Historical Provider, MD  QUEtiapine (SEROQUEL) 25 MG tablet Take 25 mg by mouth at  bedtime. Take with 50 mg tablet for a 75 mg dose    Historical Provider, MD  QUEtiapine (SEROQUEL) 50 MG tablet Take 50-75 mg by mouth 3 (three) times daily. 75 mg qhs and 50 mg bid    Historical Provider, MD  QUEtiapine (SEROQUEL) 50 MG tablet Take 50 mg by mouth 2 (two) times daily. Also take 25 mg tablet at night for a 75 mg dose (50 mg every morning)    Historical Provider, MD  ramipril (ALTACE) 10 MG capsule Take 10 mg by mouth daily.     Historical Provider, MD  risperiDONE (RISPERDAL) 1 MG/ML oral solution Take 0.5 mg by mouth 2 (two) times daily.    Historical Provider, MD  senna (SENOKOT) 8.6 MG TABS tablet Take 1 tablet by mouth at bedtime.    Historical Provider, MD  Vitamin D, Ergocalciferol, (DRISDOL) 50000 UNITS CAPS capsule Take 50,000 Units by mouth every 30 (thirty) days. On the 24th of each month    Historical Provider, MD   BP 110/70 mmHg  Pulse 73  Temp(Src) 97.6 F (36.4 C) (Oral)  Resp 20  SpO2 98% Physical Exam  Constitutional: He appears well-developed and well-nourished. No distress.  HENT:  Head: Normocephalic and atraumatic.  Mouth/Throat: Oropharynx is clear and moist. No oropharyngeal exudate.  Eyes: EOM are normal. Pupils are equal, round, and reactive to light.  Neck: Normal range of motion. Neck supple.  Cardiovascular: Normal rate and regular rhythm.  Exam reveals no friction rub.   No murmur heard. Pulmonary/Chest: Effort normal and breath sounds normal. No respiratory distress. He has no wheezes. He has no rales.  Abdominal: Soft. He exhibits no distension. There is no tenderness. There is no rebound.  Musculoskeletal: Normal range of motion. He exhibits no edema.  Neurological: He is alert. No cranial nerve deficit. He exhibits abnormal muscle tone.  Skin: No rash noted. He is not diaphoretic.  Nursing note and vitals reviewed.   ED Course  Procedures (including critical care time) Labs Review Labs Reviewed - No data to display  Imaging  Review Dg Pelvis 1-2 Views  11/24/2013   CLINICAL DATA:  Patient fell onto floor 1 day prior  EXAM: PELVIS - 1-2 VIEW  COMPARISON:  None.  FINDINGS: There is no evidence of pelvic fracture or dislocation. There is slight symmetric narrowing of both hip joints. No erosive change.  IMPRESSION: Slight symmetric narrowing of both hip joints. No fracture or dislocation.   Electronically Signed   By: Bretta BangWilliam  Woodruff M.D.   On: 11/24/2013 16:50     EKG Interpretation None      MDM   Final diagnoses:  Fall    27M presents with fall. He slipped while trying to sit on the bed and fell onto his buttocks. No loss of consciousness. He was assisted up with EMS. He was sent here for further evaluation. Here he is ambulating with assistance. Denies any pain. No long bone deformities. Vitals are stable. Will x-ray his pelvis. If normal he  is safe for discharge.  Pelvis xray ok, stable for discharge.  Elwin MochaBlair Daymeon Fischman, MD 11/24/13 [phone]

## 2013-11-24 NOTE — ED Notes (Signed)
Bed: [facility] Expected date:  Expected time:  Means of arrival:  Comments: 

## 2013-11-24 NOTE — ED Notes (Signed)
Per EMS: pt from assisted living, slipped onto floor, landing on buttocks. NO LOC, no head injury. Pt has dementia.

## 2013-11-24 NOTE — Discharge Instructions (Signed)

## 2013-11-25 ENCOUNTER — Emergency Department (HOSPITAL_COMMUNITY)
Admission: EM | Admit: 2013-11-25 | Discharge: 2013-11-25 | Disposition: A | Discharge status: Skilled Nursing Facility | Payer: Medicare Other | Attending: Emergency Medicine | Admitting: Emergency Medicine

## 2013-11-25 ENCOUNTER — Encounter (HOSPITAL_COMMUNITY): Payer: Self-pay | Admitting: Emergency Medicine

## 2013-11-25 DIAGNOSIS — I10 Essential (primary) hypertension: Secondary | ICD-10-CM | POA: Diagnosis not present

## 2013-11-25 DIAGNOSIS — Z79899 Other long term (current) drug therapy: Secondary | ICD-10-CM | POA: Insufficient documentation

## 2013-11-25 DIAGNOSIS — K219 Gastro-esophageal reflux disease without esophagitis: Secondary | ICD-10-CM | POA: Diagnosis not present

## 2013-11-25 DIAGNOSIS — F0391 Unspecified dementia with behavioral disturbance: Secondary | ICD-10-CM | POA: Insufficient documentation

## 2013-11-25 DIAGNOSIS — F03918 Unspecified dementia, unspecified severity, with other behavioral disturbance: Secondary | ICD-10-CM

## 2013-11-25 MED ORDER — DIVALPROEX SODIUM 250 MG PO DR TAB: 250.0000 mg | DELAYED_RELEASE_TABLET | Freq: Two times a day (BID) | ORAL | Status: AC
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
## 2013-11-25 NOTE — ED Provider Notes (Signed)
CSN: [id number]     Arrival date & time 11/25/13  1159 History   First MD Initiated Contact with Patient 11/25/13 1206     Chief Complaint  Patient presents with  . behavioral issues      (Consider location/radiation/quality/duration/timing/severity/associated sxs/prior Treatment) The history is provided by the patient and the EMS personnel. The history is limited by the condition of the patient.  pt with hx advanced dementia, from [facility], where pt had become agitated and poured coffee on another individual.  Pt is awake and alert. Calm. Mental status described as being c/w baseline. No report of trauma or fall, nor report of fevers.  Pt not verbally responsive to questions/advanced dementia - level 5 caveat.     Past Medical History  Diagnosis Date  . Dementia   . Hypertension   . GERD (gastroesophageal reflux disease)   . Hyperlipidemia   . Alzheimer disease    History reviewed. No pertinent past surgical history. No family history on file. History  Substance Use Topics  . Smoking status: Never Smoker   . Smokeless tobacco: Never Used  . Alcohol Use: No       Review of Systems  Unable to perform ROS: Dementia  level 5 caveat      Allergies  Review of patient's allergies indicates no known allergies.  Home Medications   Prior to Admission medications   Medication Sig Start Date End Date Taking? Authorizing Provider  acetaminophen (TYLENOL) 500 MG tablet Take 500 mg by mouth every 4 (four) hours as needed (pain). Max dose of acetaminophen is 3000 mg/24 hrs from all sources    Historical Provider, MD  aluminum-magnesium hydroxide-simethicone (MAALOX) 200-200-20 MG/5ML SUSP Take 30 mLs by mouth every 6 (six) hours as needed (heartburn/indigestion). Not to exceed 4 doses in 24 hours    Historical Provider, MD  aspirin 81 MG chewable tablet Chew 81 mg by mouth at bedtime.    Historical Provider, MD  diclofenac sodium (VOLTAREN) 1 % GEL Apply 4 g topically 4 (four) times  daily as needed (knee pain).    Historical Provider, MD  donepezil (ARICEPT) 10 MG tablet Take 10 mg by mouth at bedtime.    Historical Provider, MD  guaiFENesin (ROBITUSSIN) 100 MG/5ML liquid Take 10 mLs by mouth every 6 (six) hours as needed for cough. Not to exceed 4 doses in 24 hours    Historical Provider, MD  loperamide (IMODIUM) 2 MG capsule Take 2 mg by mouth as needed for diarrhea or loose stools. Not to exceed 8 doses in 24 hours    Historical Provider, MD  magnesium hydroxide (MILK OF MAGNESIA) 400 MG/5ML suspension Take 30 mLs by mouth at bedtime as needed (constipation).     Historical Provider, MD  Neomycin-Bacitracin-Polymyxin (TRIPLE ANTIBIOTIC EX) Apply 1 application topically daily as needed (skin tears, minor irritations). Apply after clean with normal saline, voer with bandaid or gauze, secure with tape - change as needed until healed for skin tears, abrasions or minor irritations    Historical Provider, MD  omeprazole (PRILOSEC) 20 MG capsule Take 20 mg by mouth every evening.    Historical Provider, MD  ondansetron (ZOFRAN ODT) 4 MG disintegrating tablet Take 1 tablet (4 mg total) by mouth every 8 (eight) hours as needed for nausea or vomiting. 10/17/13   Harle BattiestElizabeth Tysinger, NP  ondansetron (ZOFRAN) 4 MG tablet Take 4 mg by mouth every 8 (eight) hours as needed for nausea or vomiting.     Historical Provider, MD  PARoxetine (PAXIL) 20 MG tablet Take 20 mg by mouth daily.    Historical Provider, MD  polyethylene glycol powder (GLYCOLAX/MIRALAX) powder Take 17 g by mouth daily. Until daily soft stools  OTC 10/17/13   Harle BattiestElizabeth Tysinger, NP  PRESCRIPTION MEDICATION Apply 1 mg topically every 6 (six) hours as needed (anxiety/agitation). Lorazepam 1mg /gm gel (gloves when handling    Historical Provider, MD  QUEtiapine (SEROQUEL) 25 MG tablet Take 25 mg by mouth at bedtime. Take with 50 mg tablet for a 75 mg dose    Historical Provider, MD  QUEtiapine (SEROQUEL) 50 MG tablet Take 50-75  mg by mouth 3 (three) times daily. 75 mg qhs and 50 mg bid    Historical Provider, MD  QUEtiapine (SEROQUEL) 50 MG tablet Take 50 mg by mouth 2 (two) times daily.     Historical Provider, MD  ramipril (ALTACE) 10 MG capsule Take 10 mg by mouth daily.     Historical Provider, MD  risperiDONE (RISPERDAL) 1 MG/ML oral solution Take 0.5 mg by mouth 2 (two) times daily.    Historical Provider, MD  senna (SENOKOT) 8.6 MG TABS tablet Take 1 tablet by mouth at bedtime.    Historical Provider, MD  Vitamin D, Ergocalciferol, (DRISDOL) 50000 UNITS CAPS capsule Take 50,000 Units by mouth every 30 (thirty) days. On the 24th of each month    Historical Provider, MD   BP 136/72 mmHg  Pulse 82  Resp 20  SpO2 97% Physical Exam  Constitutional: He appears well-developed and well-nourished. No distress.  HENT:  Head: Atraumatic.  Mouth/Throat: Oropharynx is clear and moist.  Eyes: Conjunctivae are normal. Pupils are equal, round, and reactive to light. No scleral icterus.  Neck: Neck supple. No tracheal deviation present.  No stiffness or rigidity  Cardiovascular: Normal rate, regular rhythm, normal heart sounds and intact distal pulses.   Pulmonary/Chest: Effort normal and breath sounds normal. No accessory muscle usage. No respiratory distress.  Abdominal: Soft. Bowel sounds are normal. He exhibits no distension. There is no tenderness.  Genitourinary:  No cva tenderness  Musculoskeletal: Normal range of motion. He exhibits no edema or tenderness.  Neurological: He is alert.  Awake and alert. Moves bil extremities purposefully w good strength.     Skin: Skin is warm and dry. He is not diaphoretic.  Psychiatric:  Alert, content. Transiently becomes agitated when prodding or moving about, calms easily.   Nursing note and vitals reviewed.   ED Course  Procedures (including critical care time) Labs Review  Results for orders placed or performed during the hospital encounter of 11/21/13  CBC   Result Value Ref Range   WBC 5.5 4.0 - 10.5 K/uL   RBC 3.82 (L) 4.22 - 5.81 MIL/uL   Hemoglobin 11.4 (L) 13.0 - 17.0 g/dL   HCT 16.135.6 (L) 09.639.0 - 04.552.0 %   MCV 93.2 78.0 - 100.0 fL   MCH 29.8 26.0 - 34.0 pg   MCHC 32.0 30.0 - 36.0 g/dL   RDW 40.915.8 (H) 81.111.5 - 91.415.5 %   Platelets 275 150 - 400 K/uL  Comprehensive metabolic panel  Result Value Ref Range   Sodium 141 137 - 147 mEq/L   Potassium 4.2 3.7 - 5.3 mEq/L   Chloride 104 96 - 112 mEq/L   CO2 22 19 - 32 mEq/L   Glucose, Bld 115 (H) 70 - 99 mg/dL   BUN 22 6 - 23 mg/dL   Creatinine, Ser 7.821.36 (H) 0.50 - 1.35 mg/dL   Calcium 9.0  8.4 - 10.5 mg/dL   Total Protein 8.0 6.0 - 8.3 g/dL   Albumin 3.4 (L) 3.5 - 5.2 g/dL   AST 19 0 - 37 U/L   ALT 10 0 - 53 U/L   Alkaline Phosphatase 124 (H) 39 - 117 U/L   Total Bilirubin 0.4 0.3 - 1.2 mg/dL   GFR calc non Af Amer 46 (L) >90 mL/min   GFR calc Af Amer 53 (L) >90 mL/min   Anion gap 15 5 - 15  Urinalysis, Routine w reflex microscopic  Result Value Ref Range   Color, Urine YELLOW YELLOW   APPearance TURBID (A) CLEAR   Specific Gravity, Urine 1.017 1.005 - 1.030   pH 6.0 5.0 - 8.0   Glucose, UA NEGATIVE NEGATIVE mg/dL   Hgb urine dipstick NEGATIVE NEGATIVE   Bilirubin Urine NEGATIVE NEGATIVE   Ketones, ur NEGATIVE NEGATIVE mg/dL   Protein, ur NEGATIVE NEGATIVE mg/dL   Urobilinogen, UA 0.2 0.0 - 1.0 mg/dL   Nitrite NEGATIVE NEGATIVE   Leukocytes, UA SMALL (A) NEGATIVE  Urine microscopic-add on  Result Value Ref Range   Squamous Epithelial / LPF RARE RARE   WBC, UA 0-2 <3 WBC/hpf   Bacteria, UA RARE RARE   Dg Pelvis 1-2 Views  11/24/2013   CLINICAL DATA:  Patient fell onto floor 1 day prior  EXAM: PELVIS - 1-2 VIEW  COMPARISON:  None.  FINDINGS: There is no evidence of pelvic fracture or dislocation. There is slight symmetric narrowing of both hip joints. No erosive change.  IMPRESSION: Slight symmetric narrowing of both hip joints. No fracture or dislocation.   Electronically Signed    By: Bretta Bang M.D.   On: 11/24/2013 16:50      MDM   Reviewed nursing notes and prior charts for additional history.   Reviewed charts, pt has had ct head neg acute, blood work c/w baseline, and ua neg for acute uti, within past few days.  No report of any trauma of fall since, and pts mental status described as being c/w baseline.  Vitals normal, afeb.  Pt w no aggression towards staff. Is calm and alert.  Pt appears stable for d/c back to ecf.  sw has evaluated and discussed w ecf.  It appears pt had medication, risperdal, just recently added to meds, and likely any positive benefit not yet evident.  Will also try depakote for symptom improvement.  Pt remains calm and alert in ED w mental state c/w baseline, and appears stable for d/c back to ecf.       Suzi Roots, MD 11/25/13 [phone]

## 2013-11-25 NOTE — Progress Notes (Addendum)
[facility] states that patient is more anxious and is wanting to walk independently but has been in a wheelchair at all times. [facility] had a fall yesterday in another pt room. [facility] stated that patient started to throw things and wanted to try to hit staff. Pt does get ativan gel i mg ativan every 4 to 6 hours prn for agitation.    Per ALF, pt Risperdal was increased from 0.5 to 1mg  . CSW consulted with Dr. Denton LankSteinl who states that he will make recommendations however feels that patient needs to return and continue to have medications adjusted by primary Ron ParkerSamuel Bowen at the facility. CSW discussed with [facility] admission, Nicholos JohnsKathleen who states that they can do a direct admit from the assisted living. Pt may have to have medical clearance if the facility can not do at the facility but at that time, patient would have been assigned a bed. CSW discussed with French Anaracy, Production designer, theatre/television/filmadministrator at [facility] and discussed recommendations. CSW provided [facility] with [place] [place] [facility] at [phone]. Facility accepting patient back and will do direct admit to [facility].   Byrd HesselbachKristen Jelani Trueba, LCSW [phone]  ED CSW 11/25/2013 1420pm

## 2013-11-25 NOTE — Progress Notes (Signed)
  CARE MANAGEMENT ED NOTE 11/25/2013  Patient:  Ronald Perkins,Ronald Perkins   Account Number:  [id number]  Date Initiated:  11/25/2013  Documentation initiated by:  Edd ArbourGIBBS,Enrico Eaddy  Subjective/Objective Assessment:   80 yr [facility] medicare [facility]. Pt was here yesterday for same. Staff states he has dumped a cup of coffee onto another resident.  Extremely aggressive elderly male                  Subjective/Objective Assessment Detail:   pcp Ron ParkerSamuel bowen Noted by ED CM note to be connected to [facility] therefore CM not able to send in basket note to possibly  [facility]     Action/Plan:   ED CM consulted with ED Unit secretary, EDP-Steinl and ED SW about pt He is not in need of home health services nor DME CM personally interacted with pt in ED He walks but is unsteady, needs assistance   Action/Plan Detail:   Anticipated DC Date:  11/25/2013     Status Recommendation to Physician:   Result of Recommendation:    Other ED Services  Consult Working Plan   In-house referral  Clinical Social Worker   DC Planning Services  CM consult  Other    Choice offered to / List presented to:            Status of service:  Completed, signed off  ED Comments:   ED Comments Detail:

## 2013-11-25 NOTE — Discharge Instructions (Signed)
It was our pleasure to provide your ER care today - we hope that you feel better.  Take depakote as prescribed, to try to help with behavioral symptoms. Follow up with primary care doctor in the next few days to reassess patient and his medications.  Return to ER if worse, new symptoms, fevers, medical emergency, other concern.         Dementia Dementia is a general term for problems with brain function. A person with dementia has memory loss and a hard time with at least one other brain function such as thinking, speaking, or problem solving. Dementia can affect social functioning, how you do your job, your mood, or your personality. The changes may be hidden for a long time. The earliest forms of this disease are usually not detected by family or friends. Dementia can be:  Irreversible.  Potentially reversible.  Partially reversible.  Progressive. This means it can get worse over time. CAUSES  Irreversible dementia causes may include:  Degeneration of brain cells (Alzheimer disease or Lewy body dementia).  Multiple small strokes (vascular dementia).  Infection (chronic meningitis or Creutzfeldt-Jakob disease).  Frontotemporal dementia. This affects younger people, age 33 to 84, compared to those who have Alzheimer disease.  Dementia associated with other disorders like Parkinson disease, Huntington disease, or HIV-associated dementia. Potentially or partially reversible dementia causes may include:  Medicines.  Metabolic causes such as excessive alcohol intake, vitamin B12 deficiency, or thyroid disease.  Masses or pressure in the brain such as a tumor, blood clot, or hydrocephalus. SIGNS AND SYMPTOMS  Symptoms are often hard to detect. Family members or coworkers may not notice them early in the disease process. Different people with dementia may have different symptoms. Symptoms can include:  A hard time with memory, especially recent memory. Long-term memory may  not be impaired.  Asking the same question multiple times or forgetting something someone just said.  A hard time speaking your thoughts or finding certain words.  A hard time solving problems or performing familiar tasks (such as how to use a telephone).  Sudden changes in mood.  Changes in personality, especially increasing moodiness or mistrust.  Depression.  A hard time understanding complex ideas that were never a problem in the past. DIAGNOSIS  There are no specific tests for dementia.   Your health care provider may recommend a thorough evaluation. This is because some forms of dementia can be reversible. The evaluation will likely include a physical exam and getting a detailed history from you and a family member. The history often gives the best clues and suggestions for a diagnosis.  Memory testing may be done. A detailed brain function evaluation called neuropsychologic testing may be helpful.  Lab tests and brain imaging (such as a CT scan or MRI scan) are sometimes important.  Sometimes observation and re-evaluation over time is very helpful. TREATMENT  Treatment depends on the cause.   If the problem is a vitamin deficiency, it may be helped or cured with supplements.  For dementias such as Alzheimer disease, medicines are available to stabilize or slow the course of the disease. There are no cures for this type of dementia.  Your health care provider can help direct you to groups, organizations, and other health care providers to help with decisions in the care of you or your loved one. HOME CARE INSTRUCTIONS The care of individuals with dementia is varied and dependent upon the progression of the dementia. The following suggestions are intended for the person  living with, or caring for, the person with dementia.  Create a safe environment.  Remove the locks on bathroom doors to prevent the person from accidentally locking himself or herself in.  Use childproof  latches on kitchen cabinets and any place where cleaning supplies, chemicals, or alcohol are kept.  Use childproof covers in unused electrical outlets.  Install childproof devices to keep doors and windows secured.  Remove stove knobs or install safety knobs and an automatic shut-off on the stove.  Lower the temperature on water heaters.  Label medicines and keep them locked up.  Secure knives, lighters, matches, power tools, and guns, and keep these items out of reach.  Keep the house free from clutter. Remove rugs or anything that might contribute to a fall.  Remove objects that might break and hurt the person.  Make sure lighting is good, both inside and outside.  Install grab rails as needed.  Use a monitoring device to alert you to falls or other needs for help.  Reduce confusion.  Keep familiar objects and people around.  Use night lights or dim lights at night.  Label items or areas.  Use reminders, notes, or directions for daily activities or tasks.  Keep a simple, consistent routine for waking, meals, bathing, dressing, and bedtime.  Create a calm, quiet environment.  Place large clocks and calendars prominently.  Display emergency numbers and home address near all telephones.  Use cues to establish different times of the day. An example is to open curtains to let the natural light in during the day.   Use effective communication.  Choose simple words and short sentences.  Use a gentle, calm tone of voice.  Be careful not to interrupt.  If the person is struggling to find a word or communicate a thought, try to provide the word or thought.  Ask one question at a time. Allow the person ample time to answer questions. Repeat the question again if the person does not respond.  Reduce nighttime restlessness.  Provide a comfortable bed.  Have a consistent nighttime routine.  Ensure a regular walking or physical activity schedule. Involve the person  in daily activities as much as possible.  Limit napping during the day.  Limit caffeine.  Attend social events that stimulate rather than overwhelm the senses.  Encourage good nutrition and hydration.  Reduce distractions during meal times and snacks.  Avoid foods that are too hot or too cold.  Monitor chewing and swallowing ability.  Continue with routine vision, hearing, dental, and medical screenings.  Give medicines only as directed by the health care provider.  Monitor driving abilities. Do not allow the person to drive when safe driving is no longer possible.  Register with an identification program which could provide location assistance in the event of a missing person situation. SEEK MEDICAL CARE IF:   New behavioral problems start such as moodiness, aggressiveness, or seeing things that are not there (hallucinations).  Any new problem with brain function happens. This includes problems with balance, speech, or falling a lot.  Problems with swallowing develop.  Any symptoms of other illness happen. Small changes or worsening in any aspect of brain function can be a sign that the illness is getting worse. It can also be a sign of another medical illness such as infection. Seeing a health care provider right away is important. SEEK IMMEDIATE MEDICAL CARE IF:   A fever develops.  New or worsened confusion develops.  New or worsened sleepiness develops.  Staying awake becomes hard to do. Document Released: 07/03/2000 Document Revised: 05/24/2013 Document Reviewed: 06/04/2010 [facility] Patient Information 2015 [employer] [place] [place]. This information is not intended to replace advice given to you by your health care provider. Make sure you discuss any questions you have with your health care provider.

## 2013-11-25 NOTE — ED Notes (Signed)
Bed: WA02 Expected date:  Expected time:  Means of arrival:  Comments: Extremely aggressive elderly male

## 2013-11-26 ENCOUNTER — Emergency Department (HOSPITAL_COMMUNITY)
Admission: EM | Admit: 2013-11-26 | Discharge: 2013-12-01 | Disposition: A | Discharge status: Home / Self Care | Payer: Medicare Other | Attending: Emergency Medicine | Admitting: Emergency Medicine

## 2013-11-26 ENCOUNTER — Emergency Department (HOSPITAL_COMMUNITY): Payer: Medicare Other

## 2013-11-26 ENCOUNTER — Encounter (HOSPITAL_COMMUNITY): Payer: Self-pay | Admitting: Emergency Medicine

## 2013-11-26 DIAGNOSIS — Z7982 Long term (current) use of aspirin: Secondary | ICD-10-CM | POA: Insufficient documentation

## 2013-11-26 DIAGNOSIS — F0391 Unspecified dementia with behavioral disturbance: Secondary | ICD-10-CM

## 2013-11-26 DIAGNOSIS — K219 Gastro-esophageal reflux disease without esophagitis: Secondary | ICD-10-CM | POA: Diagnosis not present

## 2013-11-26 DIAGNOSIS — Z139 Encounter for screening, unspecified: Secondary | ICD-10-CM

## 2013-11-26 DIAGNOSIS — E785 Hyperlipidemia, unspecified: Secondary | ICD-10-CM | POA: Diagnosis not present

## 2013-11-26 DIAGNOSIS — Z79899 Other long term (current) drug therapy: Secondary | ICD-10-CM | POA: Diagnosis not present

## 2013-11-26 DIAGNOSIS — Z008 Encounter for other general examination: Secondary | ICD-10-CM | POA: Insufficient documentation

## 2013-11-26 DIAGNOSIS — G309 Alzheimer's disease, unspecified: Secondary | ICD-10-CM | POA: Insufficient documentation

## 2013-11-26 DIAGNOSIS — I10 Essential (primary) hypertension: Secondary | ICD-10-CM | POA: Diagnosis not present

## 2013-11-26 DIAGNOSIS — F0281 Dementia in other diseases classified elsewhere with behavioral disturbance: Secondary | ICD-10-CM | POA: Diagnosis not present

## 2013-11-26 LAB — CBC WITH DIFFERENTIAL/PLATELET
Basophils Absolute: 0 10*3/uL (ref 0.0–0.1)
Basophils Relative: 1 % (ref 0–1)
EOS ABS: 0.1 10*3/uL (ref 0.0–0.7)
EOS PCT: 2 % (ref 0–5)
HCT: 35.4 % — ABNORMAL LOW (ref 39.0–52.0)
Hemoglobin: 11.3 g/dL — ABNORMAL LOW (ref 13.0–17.0)
Lymphocytes Relative: 33 % (ref 12–46)
Lymphs Abs: 1.3 10*3/uL (ref 0.7–4.0)
MCH: 29.9 pg (ref 26.0–34.0)
MCHC: 31.9 g/dL (ref 30.0–36.0)
MCV: 93.7 fL (ref 78.0–100.0)
Monocytes Absolute: 0.5 10*3/uL (ref 0.1–1.0)
Monocytes Relative: 11 % (ref 3–12)
Neutro Abs: 2.1 10*3/uL (ref 1.7–7.7)
Neutrophils Relative %: 53 % (ref 43–77)
PLATELETS: 257 10*3/uL (ref 150–400)
RBC: 3.78 MIL/uL — AB (ref 4.22–5.81)
RDW: 15.3 % (ref 11.5–15.5)
WBC: 4 10*3/uL (ref 4.0–10.5)

## 2013-11-26 LAB — URINALYSIS, ROUTINE W REFLEX MICROSCOPIC
Bilirubin Urine: NEGATIVE
Glucose, UA: NEGATIVE mg/dL
Ketones, ur: NEGATIVE mg/dL
LEUKOCYTES UA: NEGATIVE
NITRITE: NEGATIVE
Protein, ur: NEGATIVE mg/dL
SPECIFIC GRAVITY, URINE: 1.004 — AB (ref 1.005–1.030)
Urobilinogen, UA: 0.2 mg/dL (ref 0.0–1.0)
pH: 7.5 (ref 5.0–8.0)

## 2013-11-26 LAB — COMPREHENSIVE METABOLIC PANEL
ALT: 10 U/L (ref 0–53)
ANION GAP: 16 — AB (ref 5–15)
AST: 22 U/L (ref 0–37)
Albumin: 3.3 g/dL — ABNORMAL LOW (ref 3.5–5.2)
Alkaline Phosphatase: 109 U/L (ref 39–117)
BUN: 18 mg/dL (ref 6–23)
CALCIUM: 9 mg/dL (ref 8.4–10.5)
CO2: 21 mEq/L (ref 19–32)
Chloride: 103 mEq/L (ref 96–112)
Creatinine, Ser: 1.34 mg/dL (ref 0.50–1.35)
GFR calc non Af Amer: 46 mL/min — ABNORMAL LOW (ref >90)
GFR, EST AFRICAN AMERICAN: 53 mL/min — AB (ref >90)
Glucose, Bld: 104 mg/dL — ABNORMAL HIGH (ref 70–99)
Potassium: 4.6 mEq/L (ref 3.7–5.3)
SODIUM: 140 meq/L (ref 137–147)
TOTAL PROTEIN: 7.9 g/dL (ref 6.0–8.3)
Total Bilirubin: 0.6 mg/dL (ref 0.3–1.2)

## 2013-11-26 LAB — URINE MICROSCOPIC-ADD ON

## 2013-11-26 MED ORDER — PAROXETINE HCL 20 MG PO TABS: 20.0000 mg | ORAL_TABLET | Freq: Every day | ORAL | Status: DC

## 2013-11-26 MED ORDER — LORAZEPAM 2 MG/ML IJ SOLN
1.0000 mg | INTRAMUSCULAR | Status: DC | PRN
  Administered 2013-11-26 – 2013-11-29 (×3): 1 mg via INTRAMUSCULAR
  Filled 2013-11-26 (×2): qty 1

## 2013-11-26 MED ORDER — QUETIAPINE FUMARATE 50 MG PO TABS
50.0000 mg | ORAL_TABLET | Freq: Three times a day (TID) | ORAL | Status: DC
  Administered 2013-11-26: 50 mg via ORAL
  Filled 2013-11-26: qty 1

## 2013-11-26 MED ORDER — DONEPEZIL HCL 10 MG PO TABS
10.0000 mg | ORAL_TABLET | Freq: Every day | ORAL | Status: DC
  Administered 2013-11-27 – 2013-11-30 (×4): 10 mg via ORAL
  Filled 2013-11-26 (×7): qty 1

## 2013-11-26 MED ORDER — QUETIAPINE FUMARATE 50 MG PO TABS
50.0000 mg | ORAL_TABLET | Freq: Every day | ORAL | Status: DC
Start: 2013-11-27 — End: 2013-11-29
  Administered 2013-11-27 – 2013-11-29 (×3): 50 mg via ORAL
  Filled 2013-11-26 (×3): qty 1

## 2013-11-26 MED ORDER — SENNA 8.6 MG PO TABS
1.0000 | ORAL_TABLET | Freq: Every day | ORAL | Status: DC
Start: 2013-11-26 — End: 2013-12-01
  Administered 2013-11-26 – 2013-11-30 (×3): 8.6 mg via ORAL
  Filled 2013-11-26 (×3): qty 1

## 2013-11-26 MED ORDER — RISPERIDONE 1 MG PO TABS
1.0000 mg | ORAL_TABLET | Freq: Once | ORAL | Status: AC
  Administered 2013-11-26: 1 mg via ORAL
  Filled 2013-11-26: qty 1

## 2013-11-26 MED ORDER — RISPERIDONE 1 MG/ML PO SOLN
1.0000 mg | Freq: Two times a day (BID) | ORAL | Status: DC
  Administered 2013-11-26 – 2013-11-28 (×5): 1 mg via ORAL
  Filled 2013-11-26 (×11): qty 1

## 2013-11-26 MED ORDER — HALOPERIDOL LACTATE 5 MG/ML IJ SOLN
5.0000 mg | Freq: Four times a day (QID) | INTRAMUSCULAR | Status: DC | PRN
  Administered 2013-11-26: 5 mg via INTRAMUSCULAR
  Filled 2013-11-26: qty 1

## 2013-11-26 MED ORDER — QUETIAPINE FUMARATE 50 MG PO TABS
75.0000 mg | ORAL_TABLET | Freq: Every day | ORAL | Status: DC
  Administered 2013-11-26 – 2013-11-30 (×5): 75 mg via ORAL
  Filled 2013-11-26 (×10): qty 1

## 2013-11-26 MED ORDER — LORAZEPAM 0.5 MG PO TABS
0.2500 mg | ORAL_TABLET | Freq: Four times a day (QID) | ORAL | Status: DC | PRN
  Administered 2013-11-27 – 2013-11-29 (×3): 0.25 mg via ORAL
  Filled 2013-11-26 (×4): qty 1

## 2013-11-26 MED ORDER — RAMIPRIL 10 MG PO CAPS
10.0000 mg | ORAL_CAPSULE | Freq: Every day | ORAL | Status: DC
  Administered 2013-11-26 – 2013-11-29 (×4): 10 mg via ORAL
  Filled 2013-11-26 (×6): qty 1

## 2013-11-26 MED ORDER — DIVALPROEX SODIUM 250 MG PO DR TAB
250.0000 mg | DELAYED_RELEASE_TABLET | Freq: Two times a day (BID) | ORAL | Status: DC
  Administered 2013-11-26 – 2013-11-30 (×9): 250 mg via ORAL
  Filled 2013-11-26 (×9): qty 1

## 2013-11-26 MED ORDER — ONDANSETRON HCL 4 MG PO TABS
4.0000 mg | ORAL_TABLET | Freq: Three times a day (TID) | ORAL | Status: DC | PRN
Start: 2013-11-26 — End: 2013-12-01

## 2013-11-26 NOTE — ED Notes (Signed)
Pt drinking ice water and watching tv at present time.

## 2013-11-26 NOTE — ED Notes (Signed)
Pt attempts several times to get out of bed with bed alarm and posey belt in place. Pt redirected and attempts to hit and grab at staff per sitter/Jasmine.

## 2013-11-26 NOTE — ED Notes (Signed)
Pt sleeping. 

## 2013-11-26 NOTE — ED Notes (Signed)
Pt continues to try to get up from bed. Bed alarm sounds. Pt redirected pt multiple attempts. Posy belt applied for pt safety per Fayrene FearingJames MD request.

## 2013-11-26 NOTE — ED Notes (Signed)
[facility] reports pt was not supposed to be discharged to facility but rather to [facility]. Per [facility]osp[facility] nurse [facility] refused pt. Pt here for placement.

## 2013-11-26 NOTE — ED Notes (Signed)
Pt ambulated with Clydie BraunKaren NT via walker.

## 2013-11-26 NOTE — ED Notes (Signed)
Immediately post medication administration and calm redirection pt walked with 2 staff assist in hall. Post walk pt sat in chair with ice water. Pt calm and corporative.

## 2013-11-26 NOTE — Progress Notes (Signed)
[facility] [place] please follow up with Hospice [phone] [facility].   Byrd HesselbachKristen Aaminah Forrester, LCSW [phone]  ED CSW 11/26/2013 1541pm

## 2013-11-26 NOTE — Progress Notes (Signed)
[facility] to inquire about pt hospice services and hospice admission diagnosis for services. CS[facility] about hosp[facility] diagnosis. Per Misty StanleyLisa, pt hospice diagnosis is decline due to Alzheimer's Death.   Byrd HesselbachKristen Ambra Haverstick, LCSW [phone]  ED CSW 11/26/2013 144pm

## 2013-11-26 NOTE — ED Notes (Signed)
Bed: WG95WA25 Expected date:  Expected time:  Means of arrival:  Comments: EMS-combative elderly

## 2013-11-26 NOTE — BH Assessment (Signed)
Assessment Note  Ronald Perkins is an 80 y.o. male who [facility] for agitation, combativeness, and refusing care. Pt is receiving hospice services however unable to mange patient agitation and combativeness. Patient refusing all medications and care, punching staff and other residents, spitting, and attempting to bite staff. Patient is also throwing items at staff including hot coffee and trays. Patient needs constant redirection. Pt is wheelchair bound however attempts to stand and is a fall risk. Patient has strong support from daughter. Pt has dementia and needs asisstance with medication management to assist with pt behaviors.   Axis I: Dementia with combative behaviors Axis II: Deferred Axis III:  Past Medical History  Diagnosis Date  . Dementia   . Hypertension   . GERD (gastroesophageal reflux disease)   . Hyperlipidemia   . Alzheimer disease    Axis IV: other psychosocial or environmental problems, problems related to social environment and problems with primary support group Axis V: 11-20 some danger of hurting self or others possible OR occasionally fails to maintain minimal personal hygiene OR gross impairment in communication  Past Medical History:  Past Medical History  Diagnosis Date  . Dementia   . Hypertension   . GERD (gastroesophageal reflux disease)   . Hyperlipidemia   . Alzheimer disease     History reviewed. No pertinent past surgical history.  Family History: No family history on file.  Social History:  reports that he has never smoked. He has never used smokeless tobacco. He reports that he does not drink alcohol or use illicit drugs.  Additional Social History:     CIWA: CIWA-Ar BP: 152/69 mmHg Pulse Rate: 78 COWS:    Allergies: No Known Allergies  Home Medications:  (Not in a hospital admission)  OB/GYN Status:  No LMP for male patient.  General Assessment Data Location of Assessment:  [facility] ED Is this a Tele or Face-to-Face Assessment?: Face-to-Face Is this an Initial Assessment or a Re-assessment for this encounter?: Initial Assessment Living Arrangements: Other (Comment) Can pt return to current living arrangement?: Yes Admission Status: Voluntary Is patient capable of signing voluntary admission?: Yes Transfer from: [facility] Living Arrangements: Other (Comment)  Education Status Is patient currently in school?: No  Risk to self with the past 6 months Suicidal Ideation: No Suicidal Intent: No Is patient at risk for suicide?: No Suicidal Plan?: No Access to Means: No What has been your use of drugs/alcohol within the last 12 months?:  (uknown) Previous Attempts/Gestures: No How many times?: 0 Other Self Harm Risks: no Triggers for Past Attempts: None known Intentional Self Injurious Behavior: None Family Suicide History: No Recent stressful life event(s): Turmoil (Comment) (advancing dementia) Persecutory voices/beliefs?: No Depression: No  Risk to Others within the past 6 months Homicidal Ideation: Yes-Currently Present Thoughts of Harm to Others: Yes-Currently Present Comment - Thoughts of Harm to Others: threatens to hit staff  Current Homicidal Intent: Yes-Currently Present Current Homicidal Plan: No Access to Homicidal Means: No Identified Victim: no History of harm to others?: Yes Assessment of Violence: On admission Violent Behavior Description: agitated and combative, needs redirection Does patient have access to weapons?: No Criminal Charges Pending?: No Does patient have a court date: No  Psychosis Hallucinations: None noted Delusions: None noted  Mental Status Report Appear/Hygiene: Disheveled Eye Contact: Fair Motor Activity: Agitation, Freedom of movement, Restlessness Speech: Other (Comment) (circumstantial, ) Level of  Consciousness: Alert, Combative Mood: Ambivalent Affect:  Anxious, Irritable Anxiety Level: Minimal Thought Processes: Circumstantial Judgement: Impaired Orientation: Person Obsessive Compulsive Thoughts/Behaviors: None  Cognitive Functioning Concentration: Decreased Memory: Recent Impaired, Remote Impaired IQ: Average Insight: Poor Impulse Control: Poor Appetite: Fair Sleep: Decreased Vegetative Symptoms: None  ADLScreening [facility]) Patient's cognitive ability adequate to safely complete daily activities?: No Patient able to express need for assistance with ADLs?: No Independently performs ADLs?: No  Prior Inpatient Therapy Prior Inpatient Therapy: No  Prior Outpatient Therapy Prior Outpatient Therapy: No  ADL Screening (condition at time of admission) Patient's cognitive ability adequate to safely complete daily activities?: No Patient able to express need for assistance with ADLs?: No Independently performs ADLs?: No             Advance Directives (For Healthcare) Does patient have an advance directive?: No Would patient like information on creating an advanced directive?: Yes - Educational materials given    Additional Information 1:1 In Past 12 Months?: No CIRT Risk: No Elopement Risk: No Does patient have medical clearance?: Yes     Disposition:  Disposition Initial Assessment Completed for this Encounter: Yes Disposition of Patient: Inpatient treatment program  On Site Evaluation by:   Reviewed with Physician:    Olga CoasterEED, Copper Basnett A 11/26/2013 10:53 AM

## 2013-11-26 NOTE — ED Provider Notes (Signed)
CSN: [id number]     Arrival date & time 11/26/13  0840 History   First MD Initiated Contact with Patient 11/26/13 [phone]     Chief Complaint  Patient presents with  . Aggressive Behavior      HPI  Patient transported via EMS to her facility from his extended care facility. Patient seen here November 1 with nausea and vomiting, November 4 after a fall from a chair to his buttock, and yesterday with "behavioral issues, after he reported cup of coffee on another resident. Patient continues to get up and out of bed and wander around at the care facility. Intermittently agitated. Care facility that he was discharged back to, overt house, reports that they "cannot handle him", and requests additional injury psych placement as he is now refusing his medications.  Past Medical History  Diagnosis Date  . Dementia   . Hypertension   . GERD (gastroesophageal reflux disease)   . Hyperlipidemia   . Alzheimer disease    History reviewed. No pertinent past surgical history. No family history on file. History  Substance Use Topics  . Smoking status: Never Smoker   . Smokeless tobacco: Never Used  . Alcohol Use: No    Review of Systems  Unable to perform ROS: Dementia      Allergies  Review of patient's allergies indicates no known allergies.  Home Medications   Prior to Admission medications   Medication Sig Start Date End Date Taking? Authorizing Provider  acetaminophen (TYLENOL) 500 MG tablet Take 500 mg by mouth every 4 (four) hours as needed (pain). Max dose of acetaminophen is 3000 mg/24 hrs from all sources   Yes Historical Provider, MD  aluminum-magnesium hydroxide-simethicone (MAALOX) 200-200-20 MG/5ML SUSP Take 30 mLs by mouth every 6 (six) hours as needed (heartburn/indigestion). Not to exceed 4 doses in 24 hours   Yes Historical Provider, MD  aspirin 81 MG chewable tablet Chew 81 mg by mouth at bedtime.   Yes Historical Provider, MD  diclofenac sodium (VOLTAREN) 1 % GEL Apply 4  g topically 4 (four) times daily as needed (knee pain).   Yes Historical Provider, MD  donepezil (ARICEPT) 10 MG tablet Take 10 mg by mouth at bedtime.   Yes Historical Provider, MD  guaiFENesin (ROBITUSSIN) 100 MG/5ML liquid Take 10 mLs by mouth every 6 (six) hours as needed for cough. Not to exceed 4 doses in 24 hours   Yes Historical Provider, MD  loperamide (IMODIUM) 2 MG capsule Take 2 mg by mouth as needed for diarrhea or loose stools. Not to exceed 8 doses in 24 hours   Yes Historical Provider, MD  LORazepam (ATIVAN) 0.5 MG tablet Take 0.25 mg by mouth every 6 (six) hours as needed for anxiety (or agitation.).   Yes Historical Provider, MD  magnesium hydroxide (MILK OF MAGNESIA) 400 MG/5ML suspension Take 30 mLs by mouth at bedtime as needed (constipation).    Yes Historical Provider, MD  Neomycin-Bacitracin-Polymyxin (TRIPLE ANTIBIOTIC EX) Apply 1 application topically daily as needed (skin tears, minor irritations). Apply after clean with normal saline, voer with bandaid or gauze, secure with tape - change as needed until healed for skin tears, abrasions or minor irritations   Yes Historical Provider, MD  Nutritional Supplements (NUTRITIONAL SUPPLEMENT PO) Take 1 Units by mouth 3 (three) times daily.   Yes Historical Provider, MD  omeprazole (PRILOSEC) 20 MG capsule Take 20 mg by mouth every evening.   Yes Historical Provider, MD  ondansetron (ZOFRAN) 4 MG tablet Take 4  mg by mouth every 8 (eight) hours as needed for nausea or vomiting.    Yes Historical Provider, MD  polyethylene glycol powder (GLYCOLAX/MIRALAX) powder Take 17 g by mouth daily. Until daily soft stools  OTC 10/17/13  Yes Harle Battiest, NP  PRESCRIPTION MEDICATION Apply 1 mg topically every 6 (six) hours as needed (anxiety/agitation). Lorazepam 1mg /gm gel (gloves when handling   Yes Historical Provider, MD  QUEtiapine (SEROQUEL) 25 MG tablet Take 25 mg by mouth at bedtime. Take with 50 mg tablet for a 75 mg dose   Yes  Historical Provider, MD  QUEtiapine (SEROQUEL) 50 MG tablet Take 50 mg by mouth 2 (two) times daily.    Yes Historical Provider, MD  ramipril (ALTACE) 10 MG capsule Take 10 mg by mouth daily.    Yes Historical Provider, MD  risperiDONE (RISPERDAL) 1 MG/ML oral solution Take 1 mg by mouth 2 (two) times daily.    Yes Historical Provider, MD  senna (SENOKOT) 8.6 MG TABS tablet Take 1 tablet by mouth at bedtime.   Yes Historical Provider, MD  Vitamin D, Ergocalciferol, (DRISDOL) 50000 UNITS CAPS capsule Take 50,000 Units by mouth every 30 (thirty) days. On the 24th of each month   Yes Historical Provider, MD  divalproex (DEPAKOTE) 250 MG DR tablet Take 1 tablet (250 mg total) by mouth 2 (two) times daily. 11/25/13   Suzi Roots, MD  ondansetron (ZOFRAN ODT) 4 MG disintegrating tablet Take 1 tablet (4 mg total) by mouth every 8 (eight) hours as needed for nausea or vomiting. 10/17/13   Harle Battiest, NP  PARoxetine (PAXIL) 20 MG tablet Take 20 mg by mouth daily.    Historical Provider, MD  QUEtiapine (SEROQUEL) 50 MG tablet Take 50-75 mg by mouth 3 (three) times daily. 75 mg qhs and 50 mg bid    Historical Provider, MD   BP 146/86 mmHg  Pulse 79  Temp(Src) 97.4 F (36.3 C) (Oral)  Resp 18  SpO2 99% Physical Exam  Constitutional: He is oriented to person, place, and time. No distress.  Slightly thin, but not sickly appearing elderly male. No signs of trauma.  As I am of the room he is sitting upright, wearing a hospital gown, eating breakfast with sausage biscuit and coffee. Eating with vigor.  HENT:  Head: Normocephalic.  nontender over the scalp. No signs of trauma or strike the head. No ecchymosis. Conjunctiva not pale. Mucous membranes moist.  Eyes: Conjunctivae are normal. Pupils are equal, round, and reactive to light. No scleral icterus.  Pupils symmetric reactive.  Neck: Normal range of motion. Neck supple. No thyromegaly present.  Cardiovascular: Normal rate and regular rhythm.   Exam reveals no gallop and no friction rub.   No murmur heard. Pulmonary/Chest: Effort normal and breath sounds normal. No respiratory distress. He has no wheezes. He has no rales.  Abdominal: Soft. Bowel sounds are normal. He exhibits no distension. There is no tenderness. There is no rebound.  Musculoskeletal: Normal range of motion.  Neurological: He is alert and oriented to person, place, and time.  Moves all 4 extremities. Is awake alert. Tells me "I don't want to cause anybody any problems".  Skin: Skin is warm and dry. No rash noted.  Psychiatric: He has a normal mood and affect. His behavior is normal.  Is able to follow simple conversation with yes and no answers.    ED Course  Procedures (including critical care time) Labs Review Labs Reviewed  CBC WITH DIFFERENTIAL - Abnormal; Notable  for the following:    RBC 3.78 (*)    Hemoglobin 11.3 (*)    HCT 35.4 (*)    All other components within normal limits  COMPREHENSIVE METABOLIC PANEL - Abnormal; Notable for the following:    Glucose, Bld 104 (*)    Albumin 3.3 (*)    GFR calc non Af Amer 46 (*)    GFR calc Af Amer 53 (*)    Anion gap 16 (*)    All other components within normal limits  URINALYSIS, ROUTINE W REFLEX MICROSCOPIC - Abnormal; Notable for the following:    Specific Gravity, Urine 1.004 (*)    Hgb urine dipstick SMALL (*)    All other components within normal limits  URINE MICROSCOPIC-ADD ON    Imaging Review Dg Chest 2 View  11/26/2013   CLINICAL DATA:  Screening for placement  EXAM: CHEST  2 VIEW  COMPARISON:  10/17/2013  FINDINGS: Postop CABG.  Cardiac enlargement without heart failure.  Negative for pneumonia.  No mass or effusion.  IMPRESSION: No active cardiopulmonary disease.   Electronically Signed   By: Marlan Palauharles  Clark M.D.   On: 11/26/2013 10:48   Dg Pelvis 1-2 Views  11/24/2013   CLINICAL DATA:  Patient fell onto floor 1 day prior  EXAM: PELVIS - 1-2 VIEW  COMPARISON:  None.  FINDINGS: There is no  evidence of pelvic fracture or dislocation. There is slight symmetric narrowing of both hip joints. No erosive change.  IMPRESSION: Slight symmetric narrowing of both hip joints. No fracture or dislocation.   Electronically Signed   By: Bretta BangWilliam  Woodruff M.D.   On: 11/24/2013 16:50     EKG Interpretation None      MDM   Final diagnoses:  Screening    Patient appears to have no acute medical issues per history. Labs were obtained on his visit November 1. We are awaiting possibility of [facility].     Rolland PorterMark Xzayvion Vaeth, MD 11/27/13 [phone]

## 2013-11-26 NOTE — ED Notes (Addendum)
Pt freedom of movement. Seizure pads for comfort applied on side rails, bed alarm placed, and meal given.

## 2013-11-26 NOTE — ED Notes (Signed)
Pt assisted with walker per Effie ShyShakirah.

## 2013-11-26 NOTE — Progress Notes (Signed)
Pt recommended for inpatient geriatic psych. CSW spoke with EDP who ordered CXR, Urineanlysis, and EKG. [facility] once completed. (beds available)  Byrd HesselbachKristen Jamekia Gannett, LCSW [phone]  ED CSW 11/26/2013 10:58 AM

## 2013-11-27 DIAGNOSIS — Z008 Encounter for other general examination: Secondary | ICD-10-CM | POA: Diagnosis not present

## 2013-11-27 NOTE — ED Notes (Signed)
Pt alert x4 sitter at the bedside, restless posey belt place for safety. Will continue to monitor. Pat Ronald Perkins, [name] 7:49 PM 11/27/2013    +

## 2013-11-27 NOTE — ED Notes (Signed)
Pt alert to self, sitter at the bedside for safety. Placed in Posey belt for safety, trying to get oob with out assistance, combative towards staff members, bed alam on. Will monitor. Estill DoomsSimon, Terrin Meddaugh Mahario, RN 1:36 AM 11/27/2013

## 2013-11-27 NOTE — Progress Notes (Addendum)
10:30am. CSW called [facility] admissions and spoke with intake worker Nicholos JohnsKathleen. Per Nicholos JohnsKathleen, they had not received admissions packet. CSW re-faxed packet.  12:56pm. CSW spoke with intake worker Nicholos JohnsKathleen. Per Nicholos JohnsKathleen, no beds available today but pt is under review.  Mariann LasterAlexandra Cornisha Zetino LCSWA,     ED CSW  phone: [phone]

## 2013-11-27 NOTE — ED Notes (Signed)
Patient ate a few bites of applesauce.  Continues to want to get out of the bed. Sitter at bedside.

## 2013-11-27 NOTE — Progress Notes (Signed)
4:42pm. CSW received [facility] code for pt: [id number]. [facility], and completed verbal component of admission process.  Mariann LasterAlexandra Raymound Katich LCSWA,     ED CSW  phone: [phone]

## 2013-11-27 NOTE — ED Provider Notes (Signed)
Restraint orders renewed as requested.  Pt has had aggressive behavior.  Wandering around and not responding to non physical restraints.  Linwood DibblesJon Karris Deangelo, MD 11/27/13 [phone]

## 2013-11-28 DIAGNOSIS — Z008 Encounter for other general examination: Secondary | ICD-10-CM | POA: Diagnosis not present

## 2013-11-28 NOTE — ED Notes (Signed)
Patient turned and repositioned q 2 hours.  Sitter at bedside.

## 2013-11-28 NOTE — Progress Notes (Addendum)
8:16am. CSW called [facility] to check in on status of admissions packet. Talked with worker Brett CanalesSteve. Per Brett CanalesSteve, pt is under review of medical team presently and CSW will receive call as soon as decision is made.  2:42pm. CSW called [facility] back to check in on status. Talked with worker Willa RoughHicks. Per Willa RoughHicks, pt is still under medical review. Review should happen by end of day today and CSW should receive call by end of day. CSW to continue to follow.  4:48pm. CSW called [facility] again and spoke with worker Roseanne RenoStewart. Pt still awaiting medical review, due to his medical needs and him requiring a gero bed. [facility] contact # is [phone] for updates.   Mariann LasterAlexandra Tabias Swayze LCSWA,     ED CSW  phone: [phone]

## 2013-11-28 NOTE — ED Notes (Signed)
Patient combative with care, scratching RN's arm and attempting to slap staff. Pt incontinent with urine. Pt cleaned, repositioned and provided with clean linens and paper scrubs. Posey belt removed and discontinued; sitter at bedside for safety. No s/s of distress noted at this time.

## 2013-11-29 DIAGNOSIS — Z008 Encounter for other general examination: Secondary | ICD-10-CM | POA: Diagnosis not present

## 2013-11-29 DIAGNOSIS — F0391 Unspecified dementia with behavioral disturbance: Secondary | ICD-10-CM

## 2013-11-29 DIAGNOSIS — F0390 Unspecified dementia without behavioral disturbance: Secondary | ICD-10-CM | POA: Insufficient documentation

## 2013-11-29 MED ORDER — ASPIRIN 81 MG PO CHEW
81.0000 mg | CHEWABLE_TABLET | Freq: Every day | ORAL | Status: DC
  Administered 2013-11-29 – 2013-11-30 (×2): 81 mg via ORAL
  Filled 2013-11-29 (×2): qty 1

## 2013-11-29 MED ORDER — LORAZEPAM 0.5 MG PO TABS
0.5000 mg | ORAL_TABLET | Freq: Four times a day (QID) | ORAL | Status: DC | PRN
  Administered 2013-11-29 – 2013-12-01 (×3): 0.5 mg via ORAL
  Filled 2013-11-29 (×3): qty 1

## 2013-11-29 MED ORDER — QUETIAPINE FUMARATE 50 MG PO TABS
50.0000 mg | ORAL_TABLET | Freq: Two times a day (BID) | ORAL | Status: DC
  Administered 2013-11-29 – 2013-12-01 (×2): 50 mg via ORAL
  Filled 2013-11-29 (×2): qty 1

## 2013-11-29 MED ORDER — POLYETHYLENE GLYCOL 3350 17 G PO PACK
17.0000 g | PACK | Freq: Every day | ORAL | Status: DC
  Administered 2013-11-29 – 2013-11-30 (×2): 17 g via ORAL
  Filled 2013-11-29 (×3): qty 1

## 2013-11-29 NOTE — BH Assessment (Addendum)
Spoke to [employer]inda Sales promotion account executive(director of [facility]) and informed her that patient is psych cleared. Sts that patient is not able to return back to facility as he is not safe around other residence. Discussed with Nanine MeansJamison Lord, NP and due to patient not having a safe location to return patient to remain in the ED for placement.   Writer discussed plan with Interior and spatial designerdirector of [facility]. She stated that patient had a bed this past Friday but due to [facility] not sending referral over the weekend patients bed was given away. Sts that Delorise ShinerGrace at [facility] would like referral packet re faxed and if accepted bed will not be available for another 48 hrs.   Writer re-faxed [facility] packet to [facility].

## 2013-11-29 NOTE — BH Assessment (Signed)
Discharge home per Dr. Jannifer FranklinAkintayo and Nanine MeansJamison Lord, NP.  Patient to return back to residence [facility]). Writer contacted patient's residence [facility] [phone]. Writer left message with receptionist for charge nurse/supervisor to return call.

## 2013-11-29 NOTE — Consult Note (Signed)
[facility] Face-to-Face [facility]iatry Consult   Reason for Consult:  Agitation  Referring Physician:  EDP  Ronald Perkins is an 80 y.o. male. Total Time spent with patient: 45 minutes  Assessment: AXIS I:  Dementia with agitation AXIS II:  Deferred AXIS III:   Past Medical History  Diagnosis Date  . Dementia   . Hypertension   . GERD (gastroesophageal reflux disease)   . Hyperlipidemia   . Alzheimer disease    AXIS IV:  other psychosocial or environmental problems and problems related to social environment AXIS V:  21-30 behavior considerably influenced by delusions or hallucinations OR serious impairment in judgment, communication OR inability to function in almost all areas  Plan:  Recommend psychiatric Inpatient admission when medically cleared.  Subjective:   Ronald Perkins is a 80 y.o. male patient admitted with agitation preventing care.  HPI:  The patient was sent to the ED after throwing hot coffee on another resident and having frequent outbursts of agitation.  He becomes agitated with staff when care is given.  Ronald Perkins is alert and oriented to self.  He had dementia. HPI Elements:   Location:  generalized. Quality:  acute. Severity:  severe. Timing:  intermittent. Duration:  weeks. Context:  stressors.  Past Psychiatric History: Past Medical History  Diagnosis Date  . Dementia   . Hypertension   . GERD (gastroesophageal reflux disease)   . Hyperlipidemia   . Alzheimer disease     reports that he has never smoked. He has never used smokeless tobacco. He reports that he does not drink alcohol or use illicit drugs. No family history on file. Family History Substance Abuse: No Family Supports: Yes, List: Living Arrangements: Other (Comment) Can pt return to current living arrangement?: Yes   Allergies:  No Known Allergies  ACT Assessment Complete:  Yes:    Educational Status    Risk to Self: Risk to self with the past 6 months Suicidal Ideation: No Suicidal  Intent: No Is patient at risk for suicide?: No Suicidal Plan?: No Access to Means: No What has been your use of drugs/alcohol within the last 12 months?:  (uknown) Previous Attempts/Gestures: No How many times?: 0 Other Self Harm Risks: no Triggers for Past Attempts: None known Intentional Self Injurious Behavior: None Family Suicide History: No Recent stressful life event(s): Turmoil (Comment) (advancing dementia) Persecutory voices/beliefs?: No Depression: No Substance abuse history and/or treatment for substance abuse?: No  Risk to Others: Risk to Others within the past 6 months Homicidal Ideation: Yes-Currently Present Thoughts of Harm to Others: Yes-Currently Present Comment - Thoughts of Harm to Others: threatens to hit staff  Current Homicidal Intent: Yes-Currently Present Current Homicidal Plan: No Access to Homicidal Means: No Identified Victim: no History of harm to others?: Yes Assessment of Violence: On admission Violent Behavior Description: agitated and combative, needs redirection Does patient have access to weapons?: No Criminal Charges Pending?: No Does patient have a court date: No  Abuse:    Prior Inpatient Therapy: Prior Inpatient Therapy Prior Inpatient Therapy: No  Prior Outpatient Therapy: Prior Outpatient Therapy Prior Outpatient Therapy: No  Additional Information: Additional Information 1:1 In Past 12 Months?: No CIRT Risk: No Elopement Risk: No Does patient have medical clearance?: Yes                  Objective: Blood pressure 147/79, pulse 94, temperature 97.9 F (36.6 C), temperature source Oral, resp. rate 20, SpO2 100 %.There is no weight on file to calculate  BMI.No results found for this or any previous visit (from the past 72 hour(s)). Labs are reviewed and are pertinent for no medical concerns needing attention.  Current Facility-Administered Medications  Medication Dose Route Frequency Provider Last Rate Last Dose  .  aspirin chewable tablet 81 mg  81 mg Oral QHS Nanine Means, NP      . divalproex (DEPAKOTE) DR tablet 250 mg  250 mg Oral BID Rolland Porter, MD   250 mg at 11/29/13 1001  . donepezil (ARICEPT) tablet 10 mg  10 mg Oral QHS Rolland Porter, MD   10 mg at 11/28/13 2138  . LORazepam (ATIVAN) injection 1 mg  1 mg Intramuscular Q4H PRN Rolland Porter, MD   1 mg at 11/26/13 1932  . LORazepam (ATIVAN) tablet 0.5 mg  0.5 mg Oral Q6H PRN Nanine Means, NP      . ondansetron [facility]) tablet 4 mg  4 mg Oral Q8H PRN Rolland Porter, MD      . polyethylene glycol powder (GLYCOLAX/MIRALAX) container 17 g  17 g Oral Daily Nanine Means, NP      . QUEtiapine (SEROQUEL) tablet 50 mg  50 mg Oral BID Nanine Means, NP      . QUEtiapine (SEROQUEL) tablet 75 mg  75 mg Oral QHS Rolland Porter, MD   75 mg at 11/28/13 2137  . ramipril (ALTACE) capsule 10 mg  10 mg Oral Daily Rolland Porter, MD   10 mg at 11/29/13 1001  . senna (SENOKOT) tablet 8.6 mg  1 tablet Oral QHS Rolland Porter, MD   8.6 mg at 11/26/13 2049   Current Outpatient Prescriptions  Medication Sig Dispense Refill  . aspirin 81 MG chewable tablet Chew 81 mg by mouth at bedtime.    . donepezil (ARICEPT) 10 MG tablet Take 10 mg by mouth at bedtime.    . Nutritional Supplements (NUTRITIONAL SUPPLEMENT PO) Take 1 Units by mouth 3 (three) times daily.    Marland Kitchen omeprazole (PRILOSEC) 20 MG capsule Take 20 mg by mouth every evening.    . polyethylene glycol powder (GLYCOLAX/MIRALAX) powder Take 17 g by mouth daily. Until daily soft stools  OTC 119 g 0  . PRESCRIPTION MEDICATION Apply 1 mg topically every 6 (six) hours as needed (anxiety/agitation). Lorazepam 1mg /gm gel (gloves when handling    . QUEtiapine (SEROQUEL) 25 MG tablet Take 25 mg by mouth at bedtime. Take with 50 mg tablet for a 75 mg dose    . QUEtiapine (SEROQUEL) 50 MG tablet Take 50 mg by mouth 2 (two) times daily.     . ramipril (ALTACE) 10 MG capsule Take 10 mg by mouth daily.     . risperiDONE (RISPERDAL) 1 MG/ML oral solution  Take 0.5 mg by mouth 2 (two) times daily.     Marland Kitchen senna (SENOKOT) 8.6 MG TABS tablet Take 1 tablet by mouth at bedtime.    . Vitamin D, Ergocalciferol, (DRISDOL) 50000 UNITS CAPS capsule Take 50,000 Units by mouth every 30 (thirty) days. On the 24th of each month    . acetaminophen (TYLENOL) 500 MG tablet Take 500 mg by mouth every 4 (four) hours as needed (pain). Max dose of acetaminophen is 3000 mg/24 hrs from all sources    . aluminum-magnesium hydroxide-simethicone (MAALOX) 200-200-20 MG/5ML SUSP Take 30 mLs by mouth every 6 (six) hours as needed (heartburn/indigestion). Not to exceed 4 doses in 24 hours    . diclofenac sodium (VOLTAREN) 1 % GEL Apply 4 g topically 4 (four) times daily as  needed (knee pain).    Marland Kitchen. divalproex (DEPAKOTE) 250 MG DR tablet Take 1 tablet (250 mg total) by mouth 2 (two) times daily. 60 tablet 0  . guaiFENesin (ROBITUSSIN) 100 MG/5ML liquid Take 10 mLs by mouth every 6 (six) hours as needed for cough. Not to exceed 4 doses in 24 hours    . loperamide (IMODIUM) 2 MG capsule Take 2 mg by mouth as needed for diarrhea or loose stools. Not to exceed 8 doses in 24 hours    . LORazepam (ATIVAN) 0.5 MG tablet Take 0.25 mg by mouth every 6 (six) hours as needed for anxiety (or agitation.).    Marland Kitchen. magnesium hydroxide (MILK OF MAGNESIA) 400 MG/5ML suspension Take 30 mLs by mouth at bedtime as needed (constipation).     . Neomycin-Bacitracin-Polymyxin (TRIPLE ANTIBIOTIC EX) Apply 1 application topically daily as needed (skin tears, minor irritations). Apply after clean with normal saline, voer with bandaid or gauze, secure with tape - change as needed until healed for skin tears, abrasions or minor irritations    . ondansetron (ZOFRAN ODT) 4 MG disintegrating tablet Take 1 tablet (4 mg total) by mouth every 8 (eight) hours as needed for nausea or vomiting. 10 tablet 0  . ondansetron (ZOFRAN) 4 MG tablet Take 4 mg by mouth every 8 (eight) hours as needed for nausea or vomiting.        Psychiatric Specialty Exam:     Blood pressure 147/79, pulse 94, temperature 97.9 F (36.6 C), temperature source Oral, resp. rate 20, SpO2 100 %.There is no weight on file to calculate BMI.  General Appearance: Casual  Eye Contact::  Fair  Speech:  Normal Rate  Volume:  Normal  Mood:  Irritable  Affect:  Blunt  Thought Process:  Irrelevant  Orientation:  Other:  to person  Thought Content:  difficult to assess due to his current state of dementia  Suicidal Thoughts:  No attempts  Homicidal Thoughts:  No attempts  Memory:  Immediate;   Poor Recent;   Poor Remote;   Poor  Judgement:  Impaired  Insight:  Lacking  Psychomotor Activity:  Decreased  Concentration:  Fair  Recall:  Poor  Fund of Knowledge:Fair  Language: Fair  Akathisia:  No  Handed:  Right  AIMS (if indicated):     Assets:  Housing Leisure Time Resilience Social Support  Sleep:      Musculoskeletal: Strength & Muscle Tone: decreased Gait & Station: unsteady Patient leans: N/A  Treatment Plan Summary: Daily contact with patient to assess and evaluate symptoms and progress in treatment Medication management; Seroquel increased to 50 mg BID and 75 mg at bedtime for agitation  Nanine MeansLORD, JAMISON, PMh-NP 11/29/2013 1:43 PM  Patient seen, evaluated and I agree with notes by Nurse Practitioner. Thedore MinsMojeed Ellanora Rayborn, MD

## 2013-11-29 NOTE — ED Notes (Signed)
Patient with urinary incontinence. Pt's brief changed. Pt resistant to care. No s/s of distress noted at this time.

## 2013-11-29 NOTE — Progress Notes (Addendum)
Pt on [facility] waitlist per Junious Dresseronnie.  [facility]. No beds available at this time.   Pt declined from [facility].   Byrd HesselbachKristen Esaul Dorwart, LCSW [phone]  ED CSW 11/29/2013 12:08 PM  [facility], please updated [phone]. Pt daughter is patient primary care giver, please update as well.   Byrd HesselbachKristen Areil Ottey, LCSW [phone]  ED CSW 11/29/2013 12:09 PM

## 2013-11-30 ENCOUNTER — Encounter (HOSPITAL_COMMUNITY): Payer: Self-pay | Admitting: Registered Nurse

## 2013-11-30 DIAGNOSIS — Z008 Encounter for other general examination: Secondary | ICD-10-CM | POA: Diagnosis not present

## 2013-11-30 NOTE — Progress Notes (Signed)
Late entry for 1435 11/30/13 ED CM updated covering SW, Susanna on LLOS meeting recommendations to return to snf or [facility] placement

## 2013-11-30 NOTE — BH Assessment (Signed)
Spoke with Ronald Perkins at [name] who confirms that Affidavit and Petition was received but is requesting findings and custody as well as first opinion that needs to be completed.   Spoke with Ronald Perkins who is agreeable to follow-up to ensure paperwork is completed.   Ronald PeachNajah Akshath Mccarey, MS, LCASA Assessment Counselor

## 2013-11-30 NOTE — ED Notes (Addendum)
Pt unable to chew food completely and therefore refuses hard to chew food by spitting it out. RN recommends mech soft diet.

## 2013-11-30 NOTE — BH Assessment (Signed)
Per Nicholos JohnsKathleen in intake at [facility] pt accepted by Mikael SprayBen Palumbo,NP. Nicholos JohnsKathleen is requesting that pt's legal affidavit be faxed for review before patient can be transferred. Nicholos JohnsKathleen will contact TTS once affidavit is received and reviewed.   Glorious PeachNajah Rodarius Kichline, MS, LCASA Assessment Counselor

## 2013-11-30 NOTE — BH Assessment (Signed)
This Clinical research associatewriter left message for admitting/intake department at [facility] to call TTS back regarding patient status. Unable to reach a live person at this time.  Glorious PeachNajah Yesly Gerety, MS, LCASA Assessment Counselor

## 2013-11-30 NOTE — ED Notes (Signed)
[facility], plan is medical management, still working on placement.

## 2013-11-30 NOTE — BH Assessment (Signed)
[facility] Assessment Progress Note     This clinician faxed the IVC paperwork to [facility] for their review.  Left a message on their intake number that the paperwork was being sent to them.

## 2013-12-01 DIAGNOSIS — Z008 Encounter for other general examination: Secondary | ICD-10-CM | POA: Diagnosis not present

## 2013-12-01 MED ORDER — QUETIAPINE FUMARATE 50 MG PO TABS: 50.0000 mg | ORAL_TABLET | Freq: Two times a day (BID) | ORAL | Status: AC

## 2013-12-01 MED ORDER — QUETIAPINE FUMARATE 25 MG PO TABS: 75.0000 mg | ORAL_TABLET | Freq: Every day | ORAL | Status: AC

## 2013-12-01 NOTE — ED Notes (Signed)
[facility] and sheriff here to transport [facility]. Pt remains confused and only oriented to self. He not combative at this time, no apparent signs or symptoms of distress or discomfort. [facility].

## 2014-07-20 ENCOUNTER — Other Ambulatory Visit: Payer: Self-pay | Admitting: Family Medicine

## 2014-07-20 DIAGNOSIS — R1310 Dysphagia, unspecified: Secondary | ICD-10-CM

## 2014-07-22 ENCOUNTER — Ambulatory Visit
Admission: RE | Admit: 2014-07-22 | Discharge: 2014-07-22 | Disposition: A | Discharge status: Home / Self Care | Payer: Medicare Other | Source: Ambulatory Visit | Attending: Family Medicine | Admitting: Family Medicine

## 2014-07-22 DIAGNOSIS — G253 Myoclonus: Secondary | ICD-10-CM | POA: Insufficient documentation

## 2014-07-22 DIAGNOSIS — R270 Ataxia, unspecified: Secondary | ICD-10-CM | POA: Insufficient documentation

## 2014-07-22 DIAGNOSIS — F0280 Dementia in other diseases classified elsewhere without behavioral disturbance: Secondary | ICD-10-CM | POA: Insufficient documentation

## 2014-07-22 DIAGNOSIS — I10 Essential (primary) hypertension: Secondary | ICD-10-CM | POA: Insufficient documentation

## 2014-07-22 DIAGNOSIS — R1312 Dysphagia, oropharyngeal phase: Secondary | ICD-10-CM | POA: Diagnosis present

## 2014-07-22 DIAGNOSIS — G309 Alzheimer's disease, unspecified: Secondary | ICD-10-CM | POA: Diagnosis not present

## 2014-07-22 DIAGNOSIS — R1310 Dysphagia, unspecified: Secondary | ICD-10-CM

## 2014-07-22 NOTE — Therapy (Signed)
[facility] DIAGNOSTIC RADIOLOGY [address] [place], [place], [address] Phone: [phone]   Fax:     Modified Barium Swallow  Patient Details  Name: Ronald Perkins MRN: [id number] Date of Birth: [birth year] Referring Provider:  Dorothey Baseman, MD  Encounter Date: 07/22/2014      End of Session - 07/22/14 1514    Visit Number 1   Number of Visits 1   Date for SLP Re-Evaluation 07/22/14   SLP Start Time 1300   SLP Stop Time  1359   SLP Time Calculation (min) 59 min   Activity Tolerance Patient tolerated treatment well      Past Medical History  Diagnosis Date  . Dementia   . Hypertension   . GERD (gastroesophageal reflux disease)   . Hyperlipidemia   . Alzheimer disease    Subjective: Patient behavior: Patient confused and not able to follow directions accurately  Chief complaint: Unknown   Objective:  Radiological Procedure: A videoflouroscopic evaluation of oral-preparatory, reflex initiation, and pharyngeal phases of the swallow was performed; as well as a screening of the upper esophageal phase.  I. POSTURE: Upright in MBS chair  II. VIEW: Lateral  III. COMPENSATORY STRATEGIES: Small bolus size- no observed laryngeal penetration/aspiration of small controlled thin liquid boluses  IV. BOLUSES ADMINISTERED:   Thin Liquid:4 teaspoon, 2 cup rim, self-administered, 1 straw    Nectar-thick Liquid: 3 cup rim, self-administered     Puree: 2 teaspoon   Mechanical Soft: refused   graham in applesauce  V. RESULTS OF EVALUATION: A. ORAL PREPARATORY PHASE: (The lips, tongue, and velum are observed for strength and coordination) Decreased lip and tongue control resulting in anterior spillage, delayed oral transit time, poor bolus cohesion, and pre-swallow spill.       **Overall Severity Rating: Moderate  B. SWALLOW INITIATION/REFLEX: (The reflex is normal if "triggered" by the time the bolus reached the base of the tongue)   Delayed- triggers while spilling from the valleculae to the pyriform sinuses.  **Overall Severity Rating: Moderate  C. PHARYNGEAL PHASE: (Pharyngeal function is normal if the bolus shows rapid, smooth, and continuous transit through the pharynx and there is no pharyngeal residue after the swallow) Decreased pharyngeal pressure during the swallow characterized by reduced tongue base retraction with min to mod vallecular residue  **Overall Severity Rating: Mild  D. LARYNGEAL PENETRATION: (Material entering into the laryngeal inlet/vestibule but not aspirated) 1 of 3 cup rim, self-administered nectar-thick liquid (this was the largest of the three boluses), 1of 2 cup rim, self-administered thin liquid   E. ASPIRATION: silent aspiration during the swallow of straw sip of thin liquid  F. ESOPHAGEAL PHASE: (Screening of the upper esophagus) No observed abnormality within the observable cervical esophagus.  ASSESSMENT: 81 year old man, with diagnoses including Ataxia, Myoclonic jerking, Alzheimer's dementia, and Dementia, is presenting with moderate oropharyngeal dysphagia characterized by impaired oral control for efficient/effective posterior bolus transfer.   The patient refused to masticate and swallow a  cookie within a moist substrate, so effectiveness of chewing was not assessed.  Timing of pharyngeal swallow initiation is delayed (triggering while falling from the valleculae to the pyriform sinuses).  In the pharyngeal phase, the patient exhibits decreased tongue base retraction with min to mod residue in the valleculae.  The patient had laryngeal penetration of 1 of 3 cup rim, self-administered nectar-thick liquid (this was the largest of the three boluses) and 1of 2 cup rim, self-administered thin liquid.  The patient had no  laryngeal penetration/aspiration of teaspoon boluses of thin liquid (4) and 1of 2 cup rim, self-administered thin liquid. The patient had [name], silent aspiration during the  swallow of straw sip of thin liquid.  This study supports thin liquids as long as bolus size can be controlled.      PLAN/RECOMMENDATIONS:  A. Diet: Continue current diet pending SLP clinical assessment   B. Swallowing Precautions: Per treating SLP recommendations based on clinical assessment in                    conjunction with results of this study.    C. Therapy recommendations clinical assessment    D. Results and recommendations will be routed to referring MD and faxed to treating SLP  History reviewed. No pertinent past surgical history.  There were no vitals filed for this visit.  Visit Diagnosis: Oropharyngeal dysphagia  Dysphagia - Plan: DG OP Swallowing Func-Medicare/Speech Path, DG OP Swallowing Func-Medicare/Speech Path       G-Codes - 07/22/14 1515    Functional Assessment Tool Used MBS   Functional Limitations Swallowing   Swallow Current Status ([id number]) At least 40 percent but less than 60 percent impaired, limited or restricted   Swallow Goal Status ([id number]([id number]) At least 40 percent but less than 60 percent impaired, limited or restricted   Swallow Discharge Status [phone]([id number]) At least 20 percent but less than 40 percent impaired, limited or restricted      Problem List Patient Active Problem List   Diagnosis Date Noted  . Dementia   . 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 Alzheimer's dementia 08/19/2013  . Hypertension 08/19/2013  . Ataxia 03/01/2012   Ronald PrimroseSusan G Maesyn Frisinger, MS/CCC- SLP  Ronald Perkins, Ronald Perkins 07/22/2014, 3:17 PM  [facility] DIAGNOSTIC RADIOLOGY [address] [place], [place], [address] Phone: [phone]   Fax:

## 2014-10-16 IMAGING — CT CT HEAD W/O CM
1 series · 16 of 30 positions shown, 20 images · non-contrast
Comparison: 08/19/2013

CLINICAL DATA: Altered mental status.  Incoherent thoughts.

EXAM:
CT HEAD WITHOUT CONTRAST
TECHNIQUE: Contiguous axial images were obtained from the base of the skull
through the vertex without intravenous contrast.

[Series 3: head 5.0 h30s · axial · 0.47mm/px · z∈[-105,+30]mm · 16 of 31 slices shown, 20 images]
[im 2/31  brain]
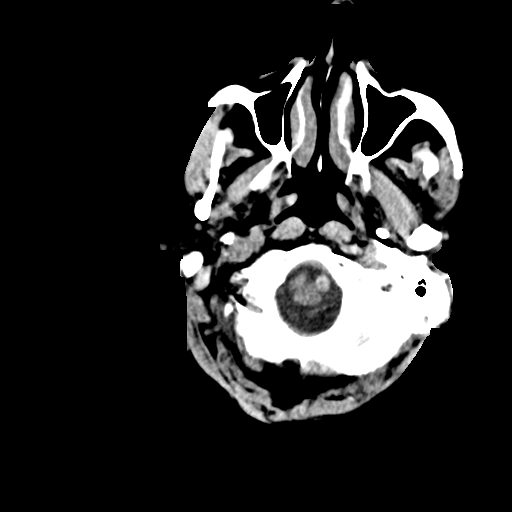
[im 2/31  bone]
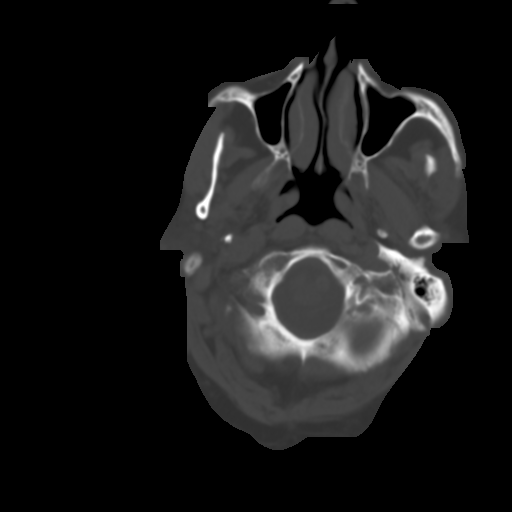
[im 4/31  brain]
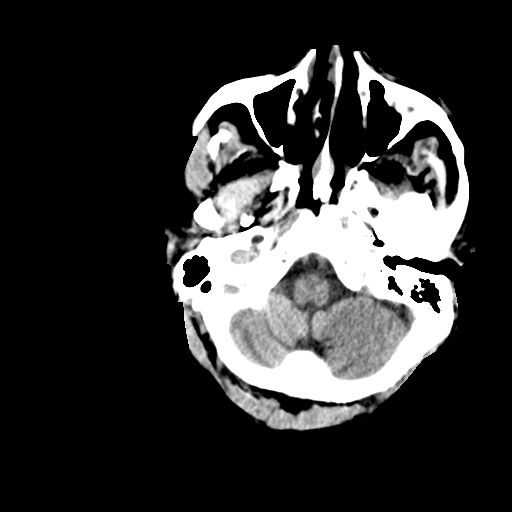
[im 6/31  brain]
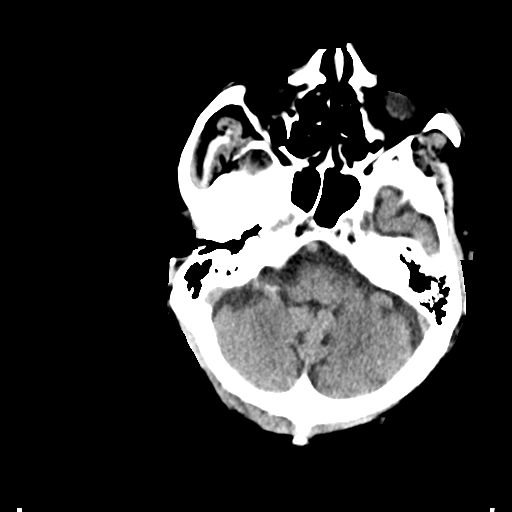
[im 8/31  brain]
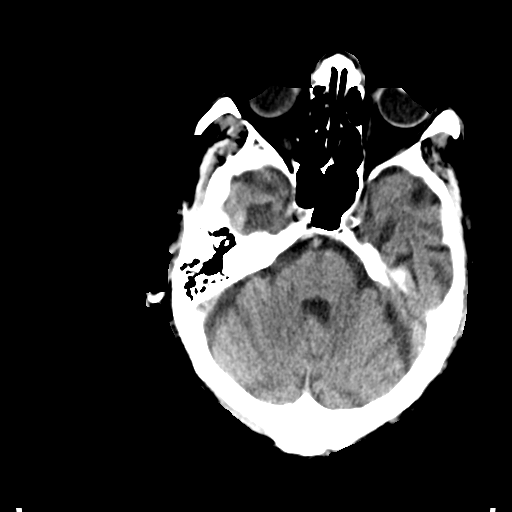
[im 9/31  brain]
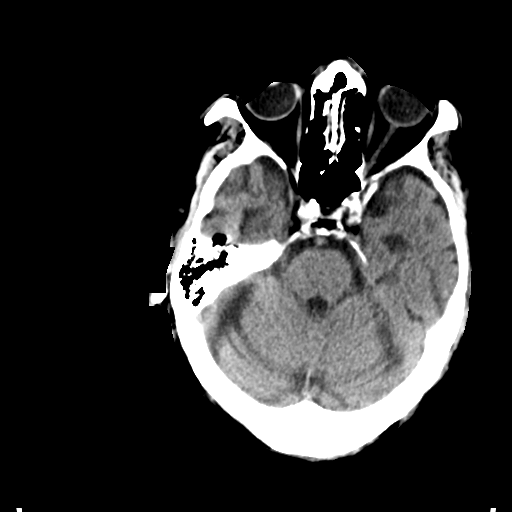
[im 9/31  bone]
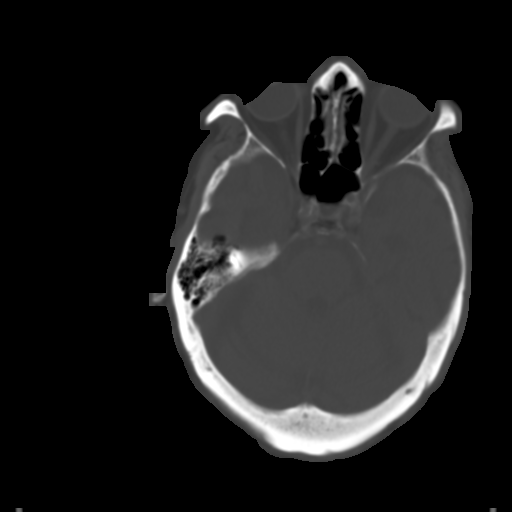
[im 11/31  brain]
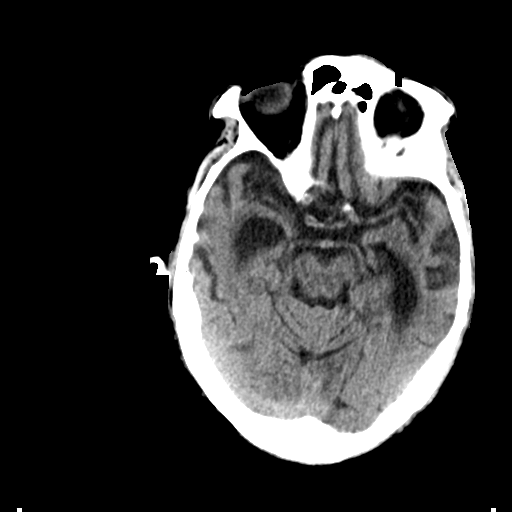
[im 13/31  brain]
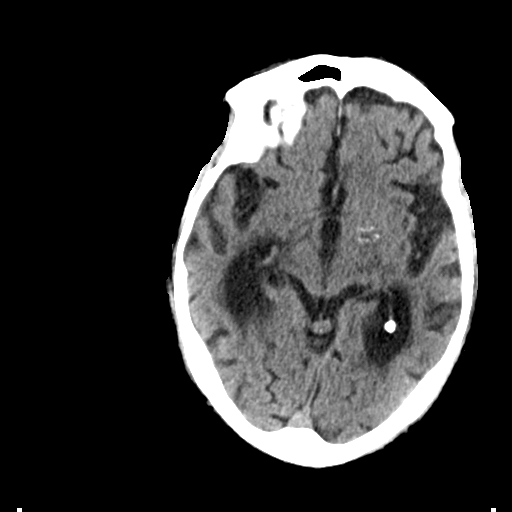
[im 15/31  brain]
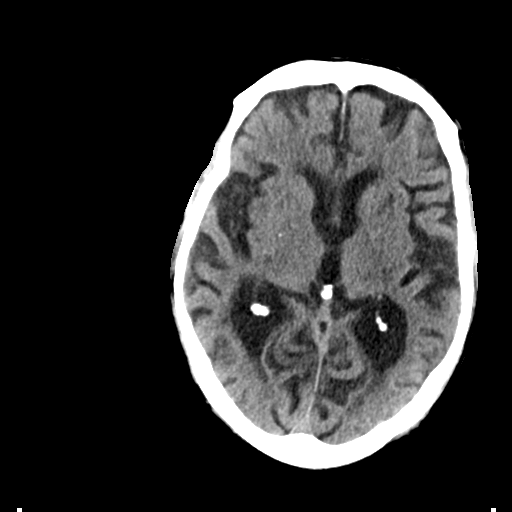
[im 16/31  brain]
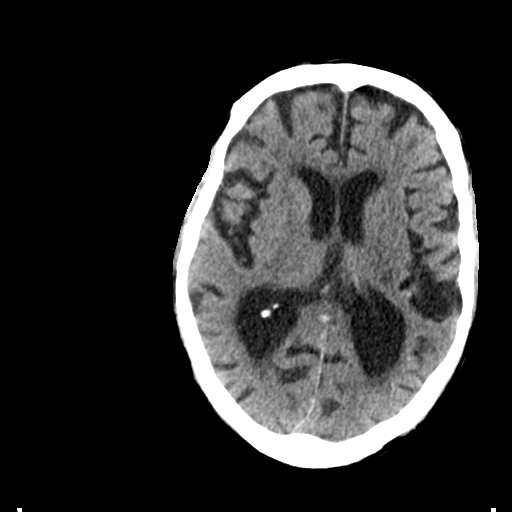
[im 16/31  bone]
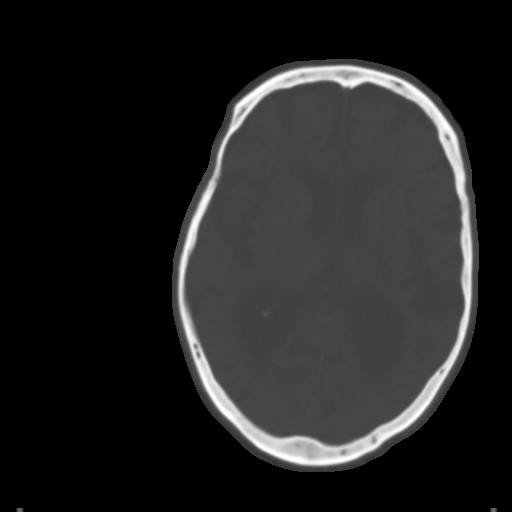
[im 18/31  brain]
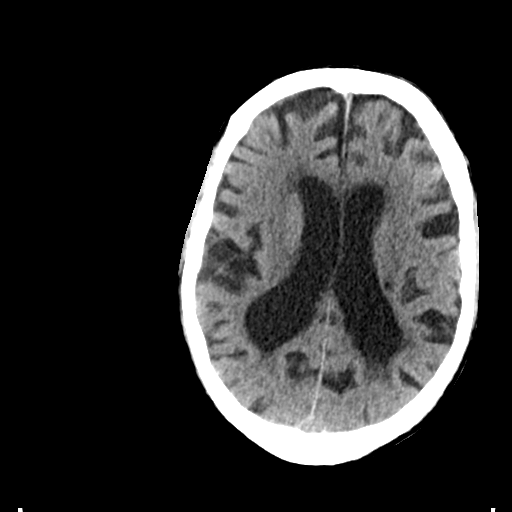
[im 20/31  brain]
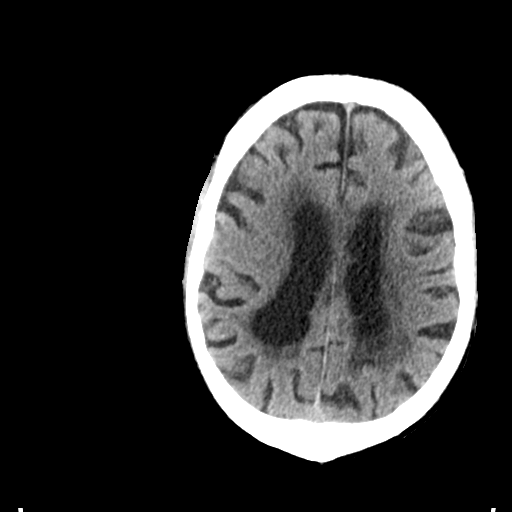
[im 22/31  brain]
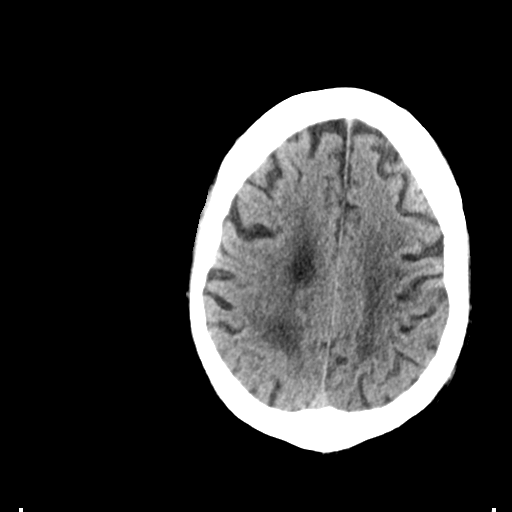
[im 23/31  brain]
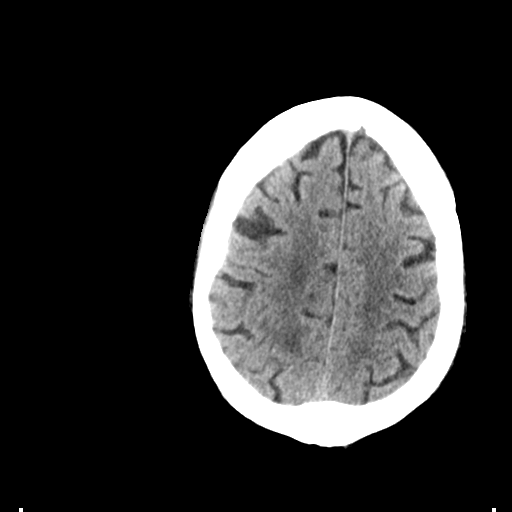
[im 23/31  bone]
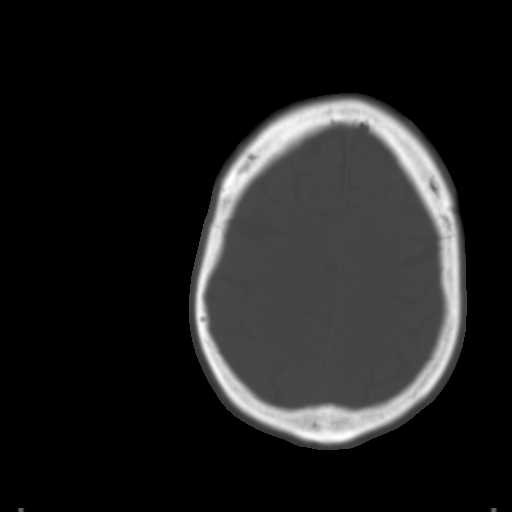
[im 25/31  brain]
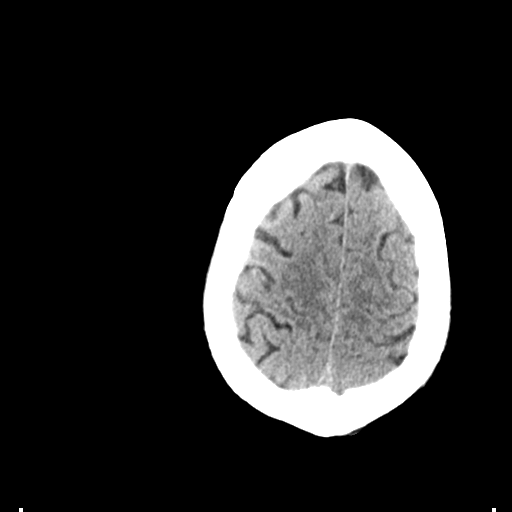
[im 27/31  brain]
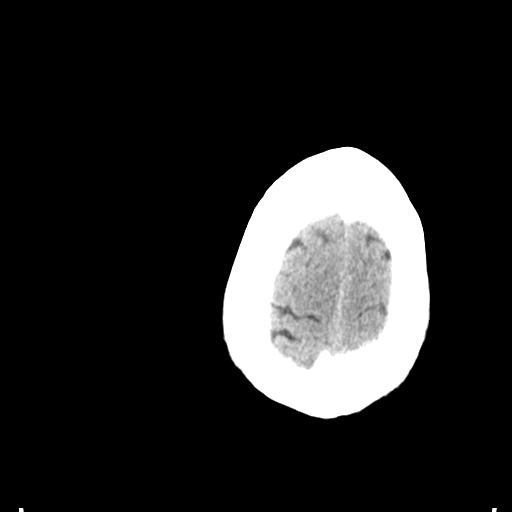
[im 29/31  brain]
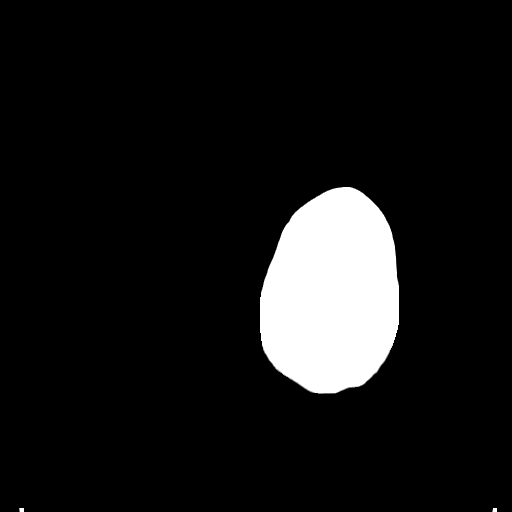

[16 of 30 positions shown; findings below may reference images not displayed]

FINDINGS: Global atrophy is stable. Chronic ischemic changes in the
periventricular white matter, external capsules, left anterior
internal capsule and brainstem are stable. No mass effect, midline
shift, or acute hemorrhage. Mastoid air cells are clear.
IMPRESSION: No acute intracranial pathology.  Chronic changes.

## 2014-12-17 IMAGING — CR DG CHEST 2V
2 series · 2 of 2 positions shown · non-contrast
Comparison: 10/17/2013

CLINICAL DATA: Screening for placement

EXAM:
CHEST  2 VIEW

[x chest ap]
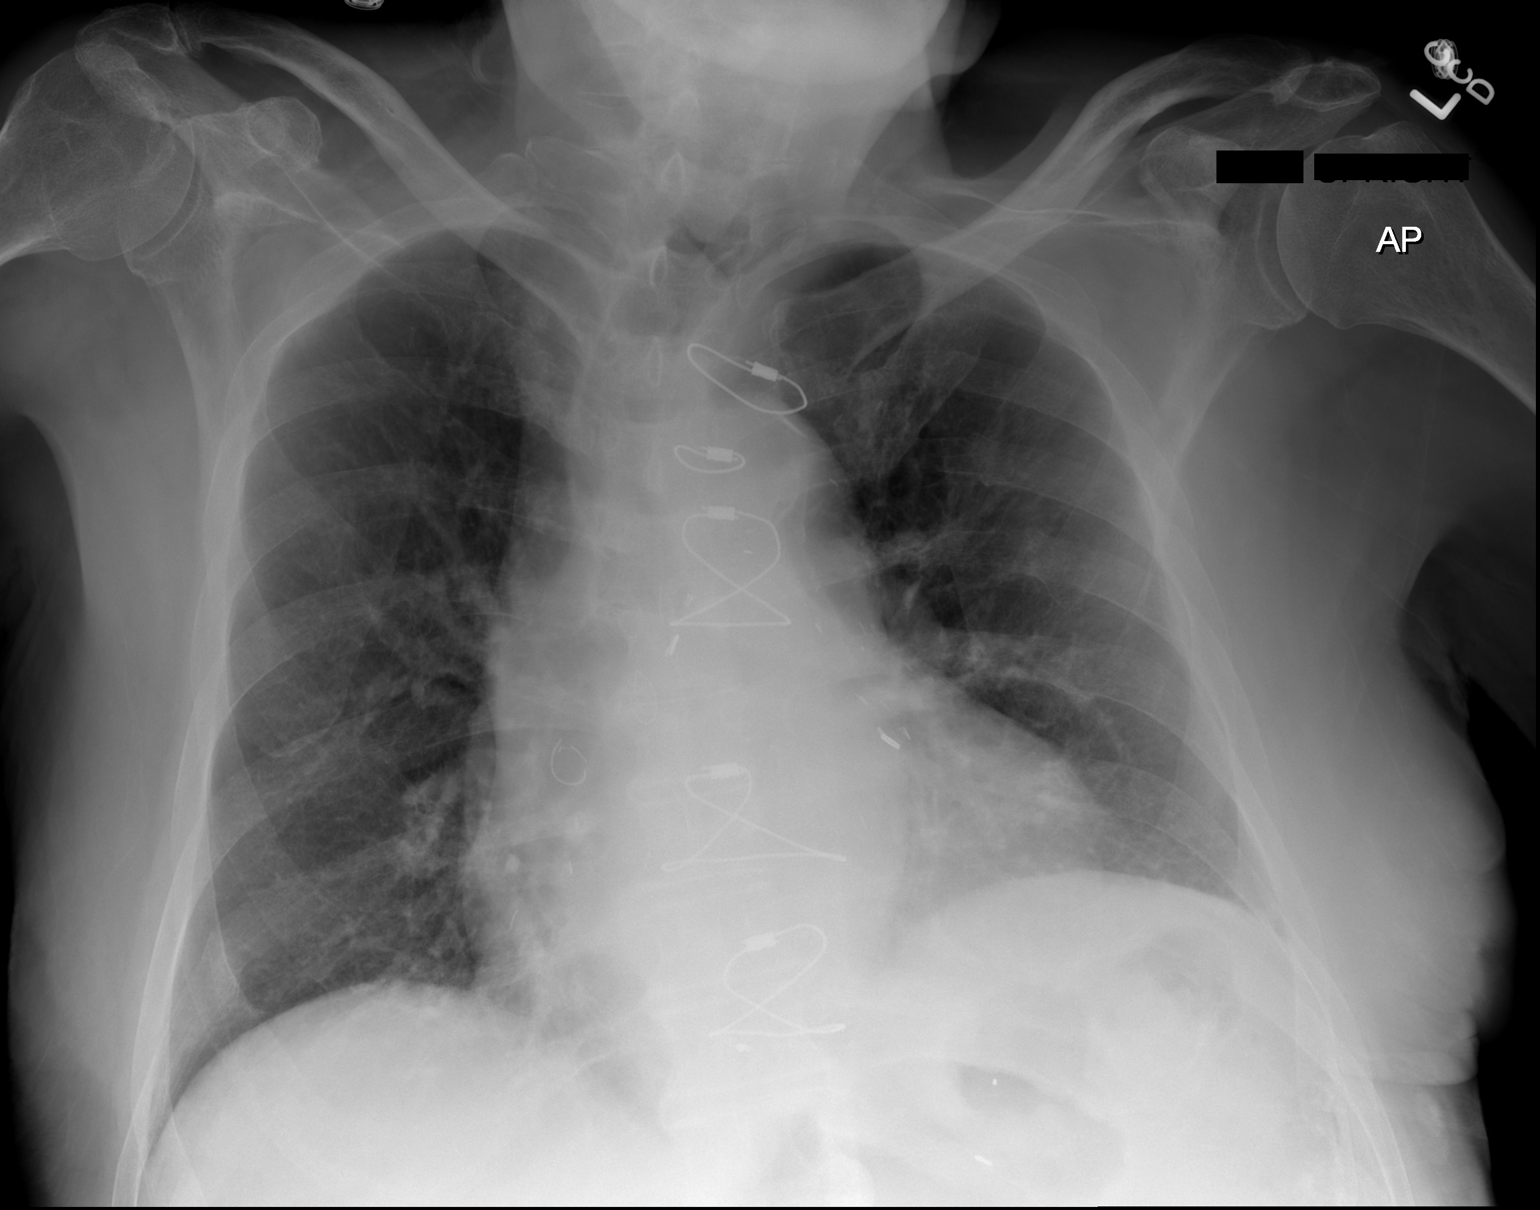

[w chest lat]
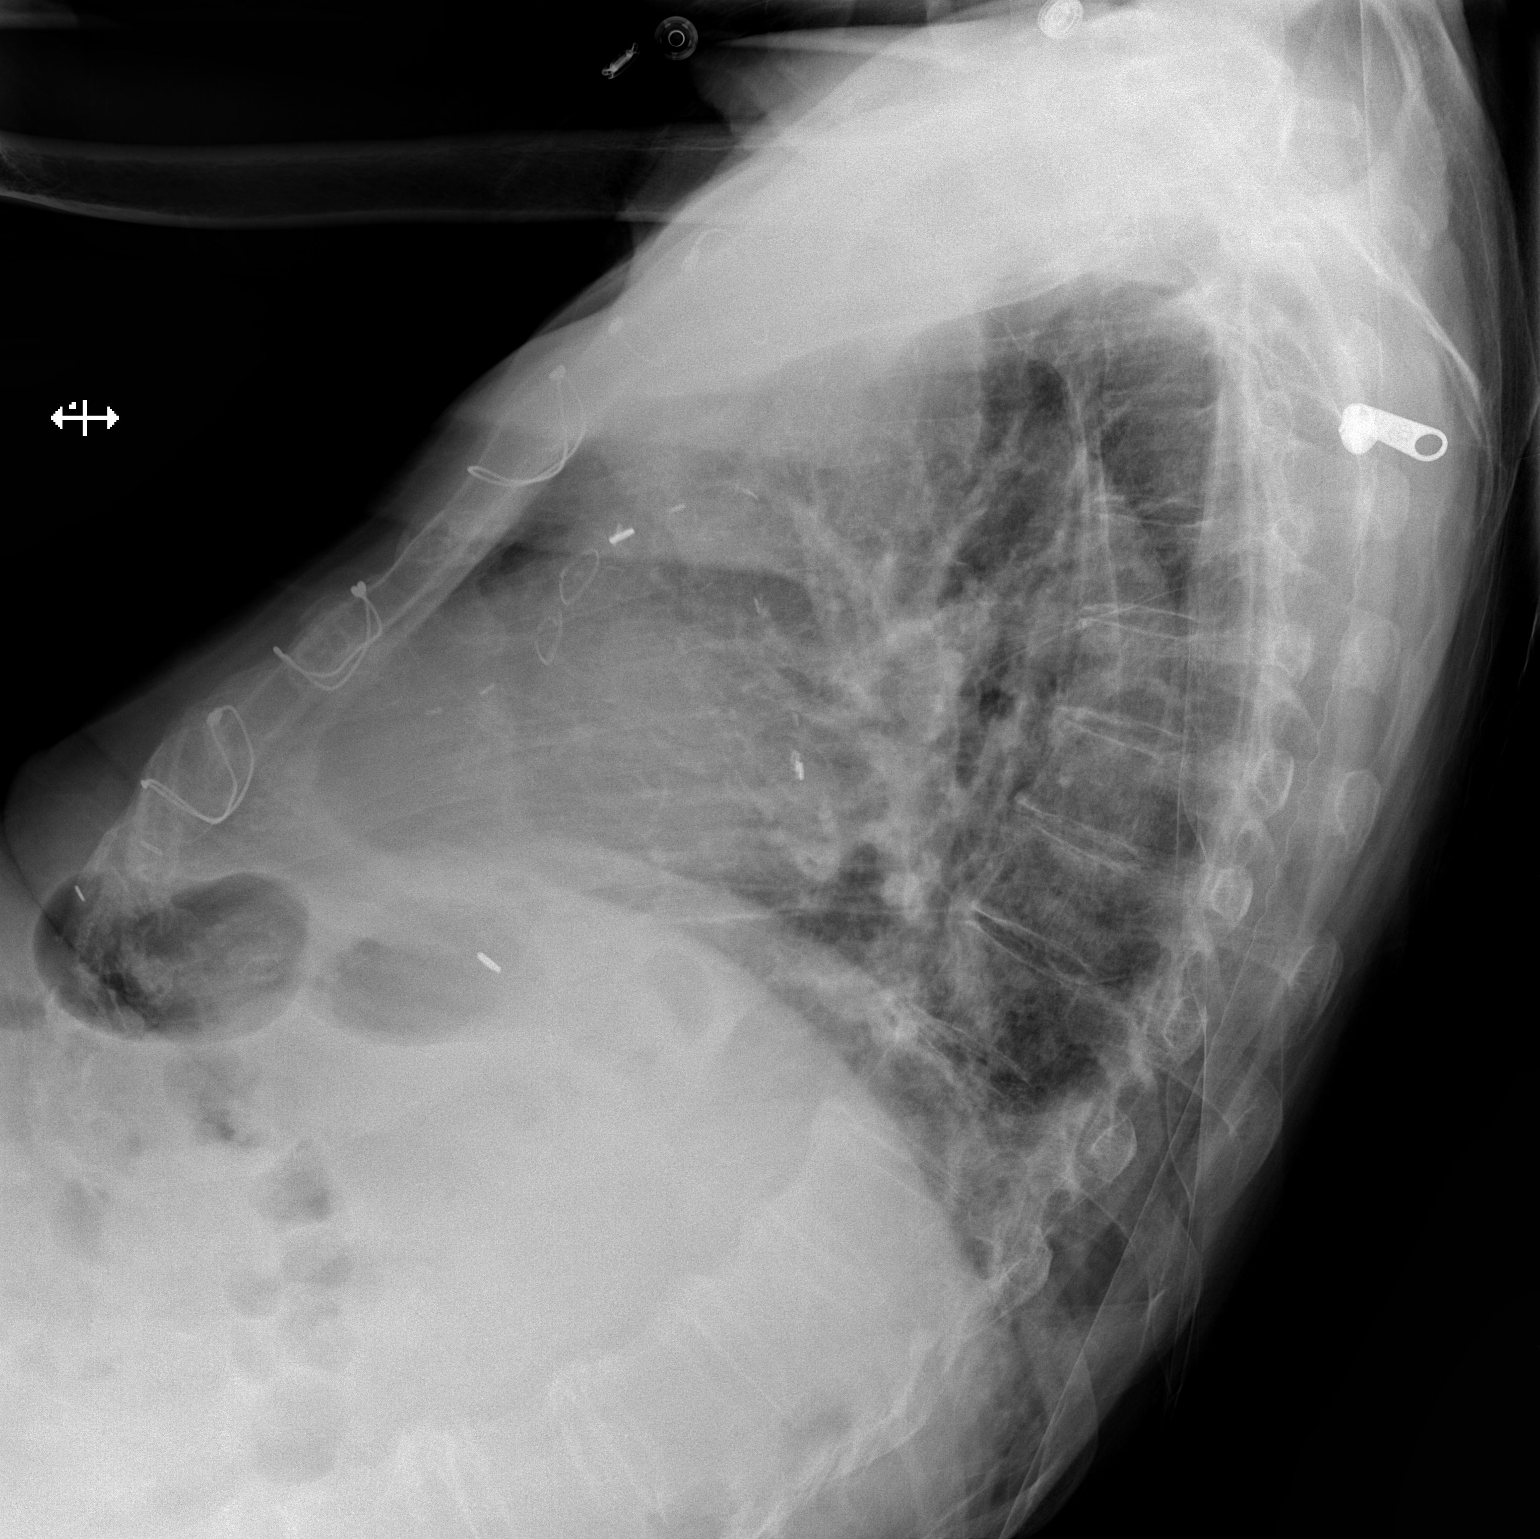

[2 of 2 positions shown; findings below may reference images not displayed]

FINDINGS: Postop CABG.  Cardiac enlargement without heart failure.

Negative for pneumonia.  No mass or effusion.
IMPRESSION: No active cardiopulmonary disease.

## 2015-04-17 ENCOUNTER — Encounter: Payer: Self-pay | Admitting: Emergency Medicine

## 2015-04-17 ENCOUNTER — Emergency Department
Admission: EM | Admit: 2015-04-17 | Discharge: 2015-04-17 | Disposition: A | Discharge status: Skilled Nursing Facility | Payer: Medicare Other | Attending: Emergency Medicine | Admitting: Emergency Medicine

## 2015-04-17 DIAGNOSIS — Z79899 Other long term (current) drug therapy: Secondary | ICD-10-CM | POA: Diagnosis not present

## 2015-04-17 DIAGNOSIS — Y92129 Unspecified place in nursing home as the place of occurrence of the external cause: Secondary | ICD-10-CM | POA: Insufficient documentation

## 2015-04-17 DIAGNOSIS — I10 Essential (primary) hypertension: Secondary | ICD-10-CM | POA: Diagnosis not present

## 2015-04-17 DIAGNOSIS — G309 Alzheimer's disease, unspecified: Secondary | ICD-10-CM | POA: Diagnosis not present

## 2015-04-17 DIAGNOSIS — Z7982 Long term (current) use of aspirin: Secondary | ICD-10-CM | POA: Insufficient documentation

## 2015-04-17 DIAGNOSIS — Y939 Activity, unspecified: Secondary | ICD-10-CM | POA: Insufficient documentation

## 2015-04-17 DIAGNOSIS — Y999 Unspecified external cause status: Secondary | ICD-10-CM | POA: Insufficient documentation

## 2015-04-17 DIAGNOSIS — E785 Hyperlipidemia, unspecified: Secondary | ICD-10-CM | POA: Insufficient documentation

## 2015-04-17 DIAGNOSIS — S0101XA Laceration without foreign body of scalp, initial encounter: Secondary | ICD-10-CM | POA: Insufficient documentation

## 2015-04-17 DIAGNOSIS — W19XXXA Unspecified fall, initial encounter: Secondary | ICD-10-CM | POA: Insufficient documentation

## 2015-04-17 DIAGNOSIS — G253 Myoclonus: Secondary | ICD-10-CM | POA: Insufficient documentation

## 2015-04-17 NOTE — ED Notes (Signed)
Pt laying in bed, non-verbal. Pt fidgeting slightly with bed-rail. Sitter at bedside. Rise and fall of chest noted.

## 2015-04-17 NOTE — ED Notes (Signed)
Pt lives at [facility], where he had an unwitnessed fall today.  Pt has history of dementia, nonverbal, psychosis, falls, and being combative.  Pt is alert, does not follow commands.  Pt has an 8cm laceration on top of head from fall.  Pt has advanced directives that came with him stating DNAR and Comfort Measures.  Placed advanced directives on the chart.

## 2015-04-17 NOTE — ED Notes (Signed)
Sitting one on one with pt. Pt is calm at this moment.

## 2015-04-17 NOTE — ED Provider Notes (Addendum)
[facility] Emergency Department Provider Note  ____________________________________________  Time seen: Seen upon arrival to the emergency department  I have reviewed the triage vital signs and the nursing notes.   HISTORY  Chief Complaint Fall and Laceration    HPI Ronald Perkins is a 82 y.o. male with a history of dementia. He is being brought in from his extended care facility after an unwitnessed fall. The patient is at his baseline mental status at this time which is nonverbal. He also has a DO NOT RESUSCITATE form on the chart with comfort measures checked.The patient is unable to give any further history. He is alert and oriented 0. No known loss of consciousness. EMS says that the patient is known to hit as well as spit with interaction.  Past Medical History  Diagnosis Date  . Dementia   . Hypertension   . GERD (gastroesophageal reflux disease)   . Hyperlipidemia   . Alzheimer disease     Patient Active Problem List   Diagnosis Date Noted  . Dementia   . 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 Alzheimer's dementia 08/19/2013  . Hypertension 08/19/2013  . Ataxia 03/01/2012    History reviewed. No pertinent past surgical history.  Current Outpatient Rx  Name  Route  Sig  Dispense  Refill  . acetaminophen (TYLENOL) 500 MG tablet   Oral   Take 500 mg by mouth every 4 (four) hours as needed (pain). Max dose of acetaminophen is 3000 mg/24 hrs from all sources         . aluminum-magnesium hydroxide-simethicone (MAALOX) 200-200-20 MG/5ML SUSP   Oral   Take 30 mLs by mouth every 6 (six) hours as needed (heartburn/indigestion). Not to exceed 4 doses in 24 hours         . aspirin 81 MG chewable tablet   Oral   Chew 81 mg by mouth at bedtime.         . diclofenac sodium (VOLTAREN) 1 % GEL   Topical   Apply 4 g topically 4 (four) times daily as needed (knee pain).         Marland Kitchen divalproex (DEPAKOTE) 250 MG DR tablet   Oral   Take 1 tablet  (250 mg total) by mouth 2 (two) times daily.   60 tablet   0   . donepezil (ARICEPT) 10 MG tablet   Oral   Take 10 mg by mouth at bedtime.         Marland Kitchen guaiFENesin (ROBITUSSIN) 100 MG/5ML liquid   Oral   Take 10 mLs by mouth every 6 (six) hours as needed for cough. Not to exceed 4 doses in 24 hours         . loperamide (IMODIUM) 2 MG capsule   Oral   Take 2 mg by mouth as needed for diarrhea or loose stools. Not to exceed 8 doses in 24 hours         . LORazepam (ATIVAN) 0.5 MG tablet   Oral   Take 0.25 mg by mouth every 6 (six) hours as needed for anxiety (or agitation.).         Marland Kitchen magnesium hydroxide (MILK OF MAGNESIA) 400 MG/5ML suspension   Oral   Take 30 mLs by mouth at bedtime as needed (constipation).          . Neomycin-Bacitracin-Polymyxin (TRIPLE ANTIBIOTIC EX)   Apply externally   Apply 1 application topically daily as needed (skin tears, minor irritations). Apply after clean with normal saline, voer  with bandaid or gauze, secure with tape - change as needed until healed for skin tears, abrasions or minor irritations         . Nutritional Supplements (NUTRITIONAL SUPPLEMENT PO)   Oral   Take 1 Units by mouth 3 (three) times daily.         Marland Kitchen omeprazole (PRILOSEC) 20 MG capsule   Oral   Take 20 mg by mouth every evening.         . ondansetron (ZOFRAN ODT) 4 MG disintegrating tablet   Oral   Take 1 tablet (4 mg total) by mouth every 8 (eight) hours as needed for nausea or vomiting.   10 tablet   0   . ondansetron (ZOFRAN) 4 MG tablet   Oral   Take 4 mg by mouth every 8 (eight) hours as needed for nausea or vomiting.          . polyethylene glycol powder (GLYCOLAX/MIRALAX) powder   Oral   Take 17 g by mouth daily. Until daily soft stools  OTC   119 g   0   . PRESCRIPTION MEDICATION   Topical   Apply 1 mg topically every 6 (six) hours as needed (anxiety/agitation). Lorazepam /gm gel (gloves when handling         . QUEtiapine  (SEROQUEL) 25 MG tablet   Oral   Take 3 tablets (75 mg total) by mouth at bedtime.   3 each   0   . QUEtiapine (SEROQUEL) 50 MG tablet   Oral   Take 1 tablet (50 mg total) by mouth 2 (two) times daily.   1 tablet   0   . ramipril (ALTACE) 10 MG capsule   Oral   Take 10 mg by mouth daily.          . risperiDONE (RISPERDAL) 1 MG/ML oral solution   Oral   Take 0.5 mg by mouth 2 (two) times daily.          Marland Kitchen senna (SENOKOT) 8.6 MG TABS tablet   Oral   Take 1 tablet by mouth at bedtime.         . Vitamin D, Ergocalciferol, (DRISDOL) 50000 UNITS CAPS capsule   Oral   Take 50,000 Units by mouth every 30 (thirty) days. On the 24th of each month           Allergies Review of patient's allergies indicates no known allergies.  No family history on file.  Social History Social History  Substance Use Topics  . Smoking status: Never Smoker   . Smokeless tobacco: Never Used  . Alcohol Use: No    Review of Systems Caveat secondary to patient with chronic dementia.  ____________________________________________   PHYSICAL EXAM:  VITAL SIGNS: ED Triage Vitals  Enc Vitals Group     BP --      Pulse --      Resp --      Temp --      Temp src --      SpO2 --      Weight 04/17/15 1743 150 lb (68.04 kg)     Height 04/17/15 1743  (1.727 m)     Head Cir --      Peak Flow --      Pain Score --      Pain Loc --      Pain Edu? --      Excl. in GC? --     Constitutional: Alert. in no acute  distress. Eyes: Conjunctivae are normal. PERRL. EOMI. Head: 3 cm laceration to the subcutaneous layers to the occipitoparietal region. No active bleeding. No surrounding pus. No depression or bogginess. The laceration is curvilinear and there is a skin flap. Nose: No congestion/rhinnorhea. Mouth/Throat: Mucous membranes are moist.  Oropharynx non-erythematous. Neck: No stridor.  No tenderness to palpation or deformity. Cardiovascular: Normal rate, regular rhythm. Grossly  normal heart sounds.  Good peripheral circulation. Respiratory: Normal respiratory effort.  No retractions. Lungs CTAB. Gastrointestinal: Soft and nontender. No distention. Musculoskeletal: No lower extremity tenderness nor edema.  No joint effusions. Neurologic:  No gross focal neurologic deficits are appreciated.  Skin:  Skin is warm, dry and intact. No rash noted. Psychiatric: Easily agitated and swings and me when I try to examine the wound.  ____________________________________________   LABS (all labs ordered are listed, but only abnormal results are displayed)  Labs Reviewed - No data to display ____________________________________________  EKG   ____________________________________________  RADIOLOGY   ____________________________________________   PROCEDURES    ____________________________________________   INITIAL IMPRESSION / ASSESSMENT AND PLAN / ED COURSE  Pertinent labs & imaging results that were available during my care of the patient were reviewed by me and considered in my medical decision making (see chart for details).  ----------------------------------------- 6:05 PM on 04/17/2015 -----------------------------------------  Attempted to call daughter, Weston BrassKathy Sealy, x2 to discuss care plan.  Message left with call back number to the emergency department.  ----------------------------------------- 7:33 PM on 04/17/2015 -----------------------------------------  Discussed the case with the patient's power of attorney Weston BrassKathy Sealy. She says "I know I the patient was sent out to the emergency department." I discussed that the patient was agitated which she says is his baseline. She agrees that is likely the patient will pull out stitches or staples. I told her that we rinsed out the wound with saline and also Steri-Strip it. She understands this plan. The patient will be discharged back to his memory care  unit. ____________________________________________   FINAL CLINICAL IMPRESSION(S) / ED DIAGNOSES  Fall. Scalp laceration.      Myrna Blazeravid Matthew Schaevitz, MD 04/17/15 1933  Also discussed the plan with the patient's nurse, Neysa Bonitohristy, who understands that the patient was daily wound cleaning as well as dressing changes.  Myrna Blazeravid Matthew Schaevitz, MD 04/17/15 2025

## 2015-04-17 NOTE — Discharge Instructions (Signed)
Fall Prevention in Hospitals, Adult °As a hospital patient, your condition and the treatments you receive can increase your risk for falls. Some additional risk factors for falls in a hospital include: °· Being in an unfamiliar environment. °· Being on bed rest. °· Your surgery. °· Taking certain medicines. °· Your tubing requirements, such as intravenous (IV) therapy or catheters. °It is important that you learn how to decrease fall risks while at the hospital. Below are important tips that can help prevent falls. °SAFETY TIPS FOR PREVENTING FALLS °Talk about your risk of falling. °· Ask your health care provider why you are at risk for falling. Is it your medicine, illness, tubing placement, or something else? °· Make a plan with your health care provider to keep you safe from falls. °· Ask your health care provider or pharmacist about side effects of your medicines. Some medicines can make you dizzy or affect your coordination. °Ask for help. °· Ask for help before getting out of bed. You may need to press your call button. °· Ask for assistance in getting safely to the toilet. °· Ask for a walker or cane to be put at your bedside. Ask that most of the side rails on your bed be placed up before your health care provider leaves the room. °· Ask family or friends to sit with you. °· Ask for things that are out of your reach, such as your glasses, hearing aids, telephone, bedside table, or call button. °Follow these tips to avoid falling: °· Stay lying or seated, rather than standing, while waiting for help. °· Wear rubber-soled slippers or shoes whenever you walk in the hospital. °· Avoid quick, sudden movements. °¨ Change positions slowly. °¨ Sit on the side of your bed before standing. °¨ Stand up slowly and wait before you start to walk. °· Let your health care provider know if there is a spill on the floor. °· Pay careful attention to the medical equipment, electrical cords, and tubes around you. °· When you  need help, use your call button by your bed or in the bathroom. Wait for one of your health care providers to help you. °· If you feel dizzy or unsure of your footing, return to bed and wait for assistance. °· Avoid being distracted by the TV, telephone, or another person in your room. °· Do not lean or support yourself on rolling objects, such as IV poles or bedside tables. °  °This information is not intended to replace advice given to you by your health care provider. Make sure you discuss any questions you have with your health care provider. °  °Document Released: 01/05/2000 Document Revised: 01/28/2014 Document Reviewed: 09/15/2011 °Elsevier Interactive Patient Education ©2016 [employer] ° °Laceration Care, Adult °A laceration is a cut that goes through all of the layers of the skin and into the tissue that is right under the skin. Some lacerations heal on their own. Others need to be closed with stitches (sutures), staples, skin adhesive strips, or skin glue. Proper laceration care minimizes the risk of infection and helps the laceration to heal better. °HOW TO CARE FOR YOUR LACERATION °If sutures or staples were used: °· Keep the wound clean and dry. °· If you were given a bandage (dressing), you should change it at least one time per day or as told by your health care provider. You should also change it if it becomes wet or dirty. °· Keep the wound completely dry for the first 24 hours or   as told by your health care provider. After that time, you may shower or bathe. However, make sure that the wound is not soaked in water until after the sutures or staples have been removed. °· Clean the wound one time each day or as told by your health care provider: °¨ Wash the wound with soap and water. °¨ Rinse the wound with water to remove all soap. °¨ Pat the wound dry with a clean towel. Do not rub the wound. °· After cleaning the wound, apply a thin layer of antibiotic ointment as told by your health care  provider. This will help to prevent infection and keep the dressing from sticking to the wound. °· Have the sutures or staples removed as told by your health care provider. °If skin adhesive strips were used: °· Keep the wound clean and dry. °· If you were given a bandage (dressing), you should change it at least one time per day or as told by your health care provider. You should also change it if it becomes dirty or wet. °· Do not get the skin adhesive strips wet. You may shower or bathe, but be careful to keep the wound dry. °· If the wound gets wet, pat it dry with a clean towel. Do not rub the wound. °· Skin adhesive strips fall off on their own. You may trim the strips as the wound heals. Do not remove skin adhesive strips that are still stuck to the wound. They will fall off in time. °If skin glue was used: °· Try to keep the wound dry, but you may briefly wet it in the shower or bath. Do not soak the wound in water, such as by swimming. °· After you have showered or bathed, gently pat the wound dry with a clean towel. Do not rub the wound. °· Do not do any activities that will make you sweat heavily until the skin glue has fallen off on its own. °· Do not apply liquid, cream, or ointment medicine to the wound while the skin glue is in place. Using those may loosen the film before the wound has healed. °· If you were given a bandage (dressing), you should change it at least one time per day or as told by your health care provider. You should also change it if it becomes dirty or wet. °· If a dressing is placed over the wound, be careful not to apply tape directly over the skin glue. Doing that may cause the glue to be pulled off before the wound has healed. °· Do not pick at the glue. The skin glue usually remains in place for 5-10 days, then it falls off of the skin. °General Instructions °· Take over-the-counter and prescription medicines only as told by your health care provider. °· If you were prescribed  an antibiotic medicine or ointment, take or apply it as told by your doctor. Do not stop using it even if your condition improves. °· To help prevent scarring, make sure to cover your wound with sunscreen whenever you are outside after stitches are removed, after adhesive strips are removed, or when glue remains in place and the wound is healed. Make sure to wear a sunscreen of at least 30 SPF. °· Do not scratch or pick at the wound. °· Keep all follow-up visits as told by your health care provider. This is important. °· Check your wound every day for signs of infection. Watch for: °¨ Redness, swelling, or pain. °¨ Fluid, blood, or   pus. °· Raise (elevate) the injured area above the level of your heart while you are sitting or lying down, if possible. °SEEK MEDICAL CARE IF: °· You received a tetanus shot and you have swelling, severe pain, redness, or bleeding at the injection site. °· You have a fever. °· A wound that was closed breaks open. °· You notice a bad smell coming from your wound or your dressing. °· You notice something coming out of the wound, such as wood or glass. °· Your pain is not controlled with medicine. °· You have increased redness, swelling, or pain at the site of your wound. °· You have fluid, blood, or pus coming from your wound. °· You notice a change in the color of your skin near your wound. °· You need to change the dressing frequently due to fluid, blood, or pus draining from the wound. °· You develop a new rash. °· You develop numbness around the wound. °SEEK IMMEDIATE MEDICAL CARE IF: °· You develop severe swelling around the wound. °· Your pain suddenly increases and is severe. °· You develop painful lumps near the wound or on skin that is anywhere on your body. °· You have a red streak going away from your wound. °· The wound is on your hand or foot and you cannot properly move a finger or toe. °· The wound is on your hand or foot and you notice that your fingers or toes look pale or  bluish. °  °This information is not intended to replace advice given to you by your health care provider. Make sure you discuss any questions you have with your health care provider. °  °Document Released: 01/07/2005 Document Revised: 05/24/2014 Document Reviewed: 01/03/2014 °Elsevier Interactive Patient Education ©2016 [employer] ° °

## 2015-05-22 DEATH — deceased
